# Patient Record
Sex: Female | Born: 1980 | Race: Black or African American | Hispanic: No | Marital: Single | Smoking: Current every day smoker
Health system: Southern US, Community
[De-identification: ages and names within clinical notes are randomized; demographics above are authoritative.]

## PROBLEM LIST (undated history)

## (undated) DIAGNOSIS — J45909 Unspecified asthma, uncomplicated: Secondary | ICD-10-CM

## (undated) DIAGNOSIS — I10 Essential (primary) hypertension: Secondary | ICD-10-CM

## (undated) DIAGNOSIS — G43909 Migraine, unspecified, not intractable, without status migrainosus: Secondary | ICD-10-CM

## (undated) HISTORY — PX: NO PAST SURGERIES: SHX2092

---

## 2011-08-09 ENCOUNTER — Encounter (HOSPITAL_COMMUNITY): Payer: Self-pay | Admitting: Emergency Medicine

## 2011-08-09 ENCOUNTER — Emergency Department (INDEPENDENT_AMBULATORY_CARE_PROVIDER_SITE_OTHER)
Admission: EM | Admit: 2011-08-09 | Discharge: 2011-08-09 | Disposition: A | Discharge status: Other Acute Inpatient Hospital | Payer: Self-pay | Source: Home / Self Care | Attending: Emergency Medicine | Admitting: Emergency Medicine

## 2011-08-09 ENCOUNTER — Encounter (HOSPITAL_COMMUNITY): Payer: Self-pay

## 2011-08-09 DIAGNOSIS — R109 Unspecified abdominal pain: Secondary | ICD-10-CM

## 2011-08-09 DIAGNOSIS — F17200 Nicotine dependence, unspecified, uncomplicated: Secondary | ICD-10-CM | POA: Insufficient documentation

## 2011-08-09 DIAGNOSIS — J45909 Unspecified asthma, uncomplicated: Secondary | ICD-10-CM | POA: Insufficient documentation

## 2011-08-09 DIAGNOSIS — I10 Essential (primary) hypertension: Secondary | ICD-10-CM | POA: Insufficient documentation

## 2011-08-09 DIAGNOSIS — E119 Type 2 diabetes mellitus without complications: Secondary | ICD-10-CM | POA: Insufficient documentation

## 2011-08-09 DIAGNOSIS — D259 Leiomyoma of uterus, unspecified: Secondary | ICD-10-CM | POA: Insufficient documentation

## 2011-08-09 DIAGNOSIS — K859 Acute pancreatitis without necrosis or infection, unspecified: Secondary | ICD-10-CM | POA: Insufficient documentation

## 2011-08-09 HISTORY — DX: Essential (primary) hypertension: I10

## 2011-08-09 HISTORY — DX: Unspecified asthma, uncomplicated: J45.909

## 2011-08-09 LAB — CBC WITH DIFFERENTIAL/PLATELET
Basophils Relative: 0 % (ref 0–1)
Eosinophils Absolute: 0.4 10*3/uL (ref 0.0–0.7)
Eosinophils Relative: 3 % (ref 0–5)
HCT: 39 % (ref 36.0–46.0)
Hemoglobin: 13 g/dL (ref 12.0–15.0)
MCH: 27.5 pg (ref 26.0–34.0)
MCHC: 33.3 g/dL (ref 30.0–36.0)
MCV: 82.6 fL (ref 78.0–100.0)
Monocytes Absolute: 0.7 10*3/uL (ref 0.1–1.0)
Monocytes Relative: 5 % (ref 3–12)
Neutrophils Relative %: 60 % (ref 43–77)

## 2011-08-09 LAB — BASIC METABOLIC PANEL
BUN: 12 mg/dL (ref 6–23)
Calcium: 9.4 mg/dL (ref 8.4–10.5)
Creatinine, Ser: 0.64 mg/dL (ref 0.50–1.10)
GFR calc Af Amer: >90 mL/min (ref >90)
GFR calc non Af Amer: >90 mL/min (ref >90)

## 2011-08-09 LAB — POCT URINALYSIS DIP (DEVICE)
Glucose, UA: 500 mg/dL — AB
Hgb urine dipstick: NEGATIVE
Nitrite: NEGATIVE
Urobilinogen, UA: 1 mg/dL (ref 0.0–1.0)

## 2011-08-09 LAB — GLUCOSE, CAPILLARY: Glucose-Capillary: 282 mg/dL — ABNORMAL HIGH (ref 70–99)

## 2011-08-09 NOTE — ED Notes (Signed)
Pt reports lower abd pain yesterday, and today it is now hurting in L upper abd; denies n/v, denies cp; reports pain does radiate up L side of body though

## 2011-08-09 NOTE — ED Provider Notes (Signed)
History     CSN: [id number]  Arrival date & time 08/09/11  1900   None     Chief Complaint  Patient presents with  . Abdominal Pain    (Consider location/radiation/quality/duration/timing/severity/associated sxs/prior treatment) HPI Comments: Yesterday pt had sharp B lower abd pain, today pain has moved over to L lateral lower quadrant and is now a dull pain.  Pain today radiates to L upper chest on occasion.    Patient is a 31 y.o. female presenting with abdominal pain. The history is provided by the patient.  Abdominal Pain The primary symptoms of the illness include abdominal pain. The primary symptoms of the illness do not include fever, nausea, vomiting, diarrhea, dysuria, vaginal discharge or vaginal bleeding. The current episode started yesterday. The onset of the illness was gradual. The problem has been gradually worsening.  The abdominal pain is located in the LLQ. The abdominal pain radiates to the chest. The severity of the abdominal pain is 8/10. The abdominal pain is relieved by nothing.  The patient states that she believes she is currently not pregnant. The patient has not had a change in bowel habit. Symptoms associated with the illness do not include chills, anorexia, constipation or back pain.    Past Medical History  Diagnosis Date  . Diabetes mellitus   . Asthma   . Hypertension     History reviewed. No pertinent past surgical history.  History reviewed. No pertinent family history.  History  Substance Use Topics  . Smoking status: Not on file  . Smokeless tobacco: Not on file  . Alcohol Use:     OB History    Grav Para Term Preterm Abortions TAB SAB Ect Mult Living                  Review of Systems  Constitutional: Negative for fever and chills.  Gastrointestinal: Positive for abdominal pain. Negative for nausea, vomiting, diarrhea, constipation and anorexia.  Genitourinary: Negative for dysuria, flank pain, vaginal bleeding and vaginal  discharge.  Musculoskeletal: Negative for back pain.  Skin: Negative for rash.    Allergies  Review of patient's allergies indicates no known allergies.  Home Medications   Current Outpatient Rx  Name Route Sig Dispense Refill  . GLIMEPIRIDE 1 MG PO TABS Oral Take 1 mg by mouth daily before breakfast.    . ALBUTEROL SULFATE HFA 108 (90 BASE) MCG/ACT IN AERS Inhalation Inhale 2 puffs into the lungs every 6 (six) hours as needed.    Marland Kitchen METFORMIN HCL 1000 MG PO TABS Oral Take 1,000 mg by mouth 2 (two) times daily with a meal.      BP 129/87  Pulse 88  Temp 98.3 F (36.8 C) (Oral)  Resp 18  SpO2 100%  Physical Exam  Constitutional: She appears well-developed and well-nourished. No distress.       obese  Cardiovascular: Normal rate and regular rhythm.   Pulmonary/Chest: Effort normal and breath sounds normal.  Abdominal: Soft. She exhibits no distension. Bowel sounds are decreased. There is tenderness in the left lower quadrant. There is no rigidity, no rebound, no guarding and no CVA tenderness.       Pt's pain in lateral, Left, upper part of LLQ/lower LUQ area.   Skin: Skin is warm, dry and intact. No rash noted.    ED Course  Procedures (including critical care time)  Labs Reviewed  POCT URINALYSIS DIP (DEVICE) - Abnormal; Notable for the following:    Glucose, UA 500 (*)  Ketones, ur TRACE (*)     All other components within normal limits  GLUCOSE, CAPILLARY - Abnormal; Notable for the following:    Glucose-Capillary 282 (*)     All other components within normal limits  POCT PREGNANCY, URINE   No results found.   1. Abdominal pain   2. Diabetes mellitus       MDM  Given pt's sx, needs dx testing. Transfer to ER for further eval.         Cathlyn Parsons, NP 08/09/11 2140

## 2011-08-09 NOTE — ED Notes (Signed)
C/o pain generalized abdominal area since yesterday , this AM has had sharp pain left flank and developed a tender knot on left flank area and pain in to her left shoulder and side of neck; deceased bowel sounds

## 2011-08-10 ENCOUNTER — Emergency Department (HOSPITAL_COMMUNITY)
Admission: EM | Admit: 2011-08-10 | Discharge: 2011-08-10 | Disposition: A | Discharge status: Home / Self Care | Payer: Self-pay | Attending: Emergency Medicine | Admitting: Emergency Medicine

## 2011-08-10 ENCOUNTER — Emergency Department (HOSPITAL_COMMUNITY): Payer: Self-pay

## 2011-08-10 DIAGNOSIS — D259 Leiomyoma of uterus, unspecified: Secondary | ICD-10-CM

## 2011-08-10 DIAGNOSIS — K859 Acute pancreatitis without necrosis or infection, unspecified: Secondary | ICD-10-CM

## 2011-08-10 DIAGNOSIS — R109 Unspecified abdominal pain: Secondary | ICD-10-CM

## 2011-08-10 HISTORY — DX: Migraine, unspecified, not intractable, without status migrainosus: G43.909

## 2011-08-10 LAB — URINALYSIS, ROUTINE W REFLEX MICROSCOPIC
Bilirubin Urine: NEGATIVE
Ketones, ur: NEGATIVE mg/dL
Nitrite: NEGATIVE
pH: 6 (ref 5.0–8.0)

## 2011-08-10 LAB — HEPATIC FUNCTION PANEL
ALT: 10 U/L (ref 0–35)
AST: 19 U/L (ref 0–37)
Albumin: 3.6 g/dL (ref 3.5–5.2)
Bilirubin, Direct: <0.1 mg/dL (ref 0.0–0.3)
Total Bilirubin: 0.1 mg/dL — ABNORMAL LOW (ref 0.3–1.2)

## 2011-08-10 LAB — GLUCOSE, CAPILLARY

## 2011-08-10 MED ORDER — MORPHINE SULFATE 4 MG/ML IJ SOLN
4.0000 mg | Freq: Once | INTRAMUSCULAR | Status: AC
  Administered 2011-08-10: 4 mg via INTRAVENOUS
  Filled 2011-08-10: qty 1

## 2011-08-10 MED ORDER — SODIUM CHLORIDE 0.9 % IV BOLUS (SEPSIS)
1000.0000 mL | Freq: Once | INTRAVENOUS | Status: AC
  Administered 2011-08-10: 1000 mL via INTRAVENOUS

## 2011-08-10 MED ORDER — IOHEXOL 300 MG/ML  SOLN: 100.0000 mL | Freq: Once | INTRAMUSCULAR | Status: DC | PRN

## 2011-08-10 MED ORDER — PROMETHAZINE HCL 25 MG PO TABS: 25.0000 mg | ORAL_TABLET | Freq: Four times a day (QID) | ORAL | Status: DC | PRN

## 2011-08-10 MED ORDER — IOHEXOL 300 MG/ML  SOLN
20.0000 mL | INTRAMUSCULAR | Status: AC
  Administered 2011-08-10: 20 mL via ORAL

## 2011-08-10 MED ORDER — ONDANSETRON HCL 4 MG/2ML IJ SOLN
4.0000 mg | Freq: Once | INTRAMUSCULAR | Status: AC
  Administered 2011-08-10: 4 mg via INTRAVENOUS
  Filled 2011-08-10: qty 2

## 2011-08-10 MED ORDER — HYDROCODONE-ACETAMINOPHEN 5-325 MG PO TABS: 1.0000 | ORAL_TABLET | ORAL | Status: AC | PRN

## 2011-08-10 NOTE — ED Notes (Signed)
The pt was sen here from [facility] with multiple symptoms since yesterday.  She has had rt abd pain  Neck pain and knots in her lt chest abd.  No distress

## 2011-08-10 NOTE — ED Notes (Signed)
1000 cc nss added to iv 

## 2011-08-10 NOTE — ED Notes (Signed)
Back to u/s

## 2011-08-10 NOTE — ED Notes (Signed)
To ct

## 2011-08-10 NOTE — ED Provider Notes (Signed)
History     CSN: [id number]  Arrival date & time 08/09/11  2144   First MD Initiated Contact with Patient 08/10/11 0110      Chief Complaint  Patient presents with  . Abdominal Pain   HPI  History provided by the patient. Patient is a 31 year old female with history of diabetes, hypertension, asthma migraines who presents with complaints of left-sided abdominal pain that began yesterday. Pain is described as a constant sharp pain does worsen. Pain is primary located in the left lower abdomen. Patient also reports feeling a bulge or mass to the area. Pain does radiate some to the left upper quadrant and side of abdomen. Patient denies any appetite change. She denies any fever, chills, nausea, vomiting. She denies any dysuria, hematuria, urinary frequency, vaginal bleeding or vaginal discharge. Patient does report currently being on menstrual cycle. She denies any abnormalities.    Past Medical History  Diagnosis Date  . Diabetes mellitus   . Asthma   . Hypertension   . Migraine     No past surgical history on file.  No family history on file.  History  Substance Use Topics  . Smoking status: Current Everyday Smoker -- 0.5 packs/day  . Smokeless tobacco: Not on file  . Alcohol Use: Yes     occasion    OB History    Grav Para Term Preterm Abortions TAB SAB Ect Mult Living                  Review of Systems  Constitutional: Negative for fever, chills and appetite change.  Respiratory: Negative for cough and shortness of breath.   Cardiovascular: Negative for chest pain.  Gastrointestinal: Positive for abdominal pain. Negative for nausea, vomiting, diarrhea, constipation and blood in stool.  Genitourinary: Negative for dysuria, frequency, hematuria, flank pain, vaginal discharge and menstrual problem.    Allergies  Review of patient's allergies indicates no known allergies.  Home Medications   Current Outpatient Rx  Name Route Sig Dispense Refill  . ALBUTEROL  SULFATE HFA 108 (90 BASE) MCG/ACT IN AERS Inhalation Inhale 2 puffs into the lungs every 6 (six) hours as needed. For shortness of breath    . GLIMEPIRIDE 1 MG PO TABS Oral Take 1 mg by mouth daily before breakfast.    . METFORMIN HCL 1000 MG PO TABS Oral Take 1,000 mg by mouth 2 (two) times daily with a meal.      BP 121/87  Pulse 87  Temp 96.9 F (36.1 C) (Oral)  Resp 16  SpO2 99%  LMP 07/31/2011  Physical Exam  Nursing note and vitals reviewed. Constitutional: She is oriented to person, place, and time. She appears well-developed and well-nourished. No distress.  HENT:  Head: Normocephalic.  Cardiovascular: Normal rate and regular rhythm.   Pulmonary/Chest: Effort normal and breath sounds normal. No respiratory distress. She has no wheezes. She has no rales.  Abdominal: Soft. There is tenderness in the left upper quadrant and left lower quadrant. There is no rebound, no guarding, no CVA tenderness, no tenderness at McBurney's point and negative Murphy's sign.       Obese.  Genitourinary:       Chaperone was present. Patient was unable to tolerate speculum exam secondary to discomfort. Patient also had significant discomfort on bimanual exam. She does not seem to have any significant adnexal tenderness  Neurological: She is alert and oriented to person, place, and time.  Skin: Skin is warm and dry. No rash noted.  Psychiatric: She has a normal mood and affect. Her behavior is normal.    ED Course  Procedures   Results for orders placed during the hospital encounter of 08/10/11  URINALYSIS, ROUTINE W REFLEX MICROSCOPIC      Component Value Range   Color, Urine YELLOW  YELLOW   APPearance CLOUDY (*) CLEAR   Specific Gravity, Urine 1.015  1.005 - 1.030   pH 6.0  5.0 - 8.0   Glucose, UA >1000 (*) NEGATIVE mg/dL   Hgb urine dipstick TRACE (*) NEGATIVE   Bilirubin Urine NEGATIVE  NEGATIVE   Ketones, ur NEGATIVE  NEGATIVE mg/dL   Protein, ur NEGATIVE  NEGATIVE mg/dL    Urobilinogen, UA 0.2  0.0 - 1.0 mg/dL   Nitrite NEGATIVE  NEGATIVE   Leukocytes, UA NEGATIVE  NEGATIVE  CBC WITH DIFFERENTIAL      Component Value Range   WBC 14.4 (*) 4.0 - 10.5 K/uL   RBC 4.72  3.87 - 5.11 MIL/uL   Hemoglobin 13.0  12.0 - 15.0 g/dL   HCT 36.6  44.0 - 34.7 %   MCV 82.6  78.0 - 100.0 fL   MCH 27.5  26.0 - 34.0 pg   MCHC 33.3  30.0 - 36.0 g/dL   RDW 42.5  95.6 - 38.7 %   Platelets 346  150 - 400 K/uL   Neutrophils Relative 60  43 - 77 %   Neutro Abs 8.6 (*) 1.7 - 7.7 K/uL   Lymphocytes Relative 33  12 - 46 %   Lymphs Abs 4.7 (*) 0.7 - 4.0 K/uL   Monocytes Relative 5  3 - 12 %   Monocytes Absolute 0.7  0.1 - 1.0 K/uL   Eosinophils Relative 3  0 - 5 %   Eosinophils Absolute 0.4  0.0 - 0.7 K/uL   Basophils Relative 0  0 - 1 %   Basophils Absolute 0.0  0.0 - 0.1 K/uL  BASIC METABOLIC PANEL      Component Value Range   Sodium 138  135 - 145 mEq/L   Potassium 3.6  3.5 - 5.1 mEq/L   Chloride 101  96 - 112 mEq/L   CO2 26  19 - 32 mEq/L   Glucose, Bld 325 (*) 70 - 99 mg/dL   BUN 12  6 - 23 mg/dL   Creatinine, Ser 5.64  0.50 - 1.10 mg/dL   Calcium 9.4  8.4 - 33.2 mg/dL   GFR calc non Af Amer >90  >90 mL/min   GFR calc Af Amer >90  >90 mL/min  URINE MICROSCOPIC-ADD ON      Component Value Range   Squamous Epithelial / LPF RARE  RARE   WBC, UA 3-6  <3 WBC/hpf   RBC / HPF 3-6  <3 RBC/hpf   Bacteria, UA RARE  RARE  HEPATIC FUNCTION PANEL      Component Value Range   Total Protein 7.6  6.0 - 8.3 g/dL   Albumin 3.6  3.5 - 5.2 g/dL   AST 19  0 - 37 U/L   ALT 10  0 - 35 U/L   Alkaline Phosphatase 83  39 - 117 U/L   Total Bilirubin 0.1 (*) 0.3 - 1.2 mg/dL   Bilirubin, Direct <9.5  0.0 - 0.3 mg/dL   Indirect Bilirubin NOT CALCULATED  0.3 - 0.9 mg/dL  LIPASE, BLOOD      Component Value Range   Lipase 122 (*) 11 - 59 U/L  POCT PREGNANCY, URINE  Component Value Range   Preg Test, Ur NEGATIVE  NEGATIVE       US Pelvis Complete  08/10/2011  *RADIOLOGY REPORT*   Clinical Data: Evaluate fibroid from CT scan.  TRANSABDOMINAL ULTRASOUND OF PELVIS  Technique:  Transabdominal ultrasound examination of the pelvis was performed including evaluation of the uterus, ovaries, adnexal regions, and pelvic cul-de-sac.  Comparison:  CT 08/10/2011  Findings:  Uterus:  The uterus is anteverted and measures 9.6 x 5.6 x 8.5 cm. There is a heterogeneous mass arising from the uterine fundus measuring about 4.8 x 4.5 x 5 cm consistent with a fibroid.  Endometrium: The endometrium is obscured by the large fibroid is not visualized.  Right ovary: Right ovary is not visualized.  Left ovary: Left ovary is not visualized.  Other Findings:  No free fluid  IMPRESSION: Heterogeneous mass on the uterus most consistent with fibroid. Ovaries not visualized.  No free fluid.  Original Report Authenticated By: Marlon Pel, M.D.   Ct Abdomen Pelvis W Contrast  08/10/2011  *RADIOLOGY REPORT*  Clinical Data: Left upper quadrant abdominal pain.  Left flank pain.  Decreased bowel sounds.  CT ABDOMEN AND PELVIS WITH CONTRAST  Technique:  Multidetector CT imaging of the abdomen and pelvis was performed following the standard protocol during bolus administration of intravenous contrast.  Contrast:  100 ml Omnipaque 300.  Hand injection due to a 22 gauge IV catheter.  Comparison: None.  Findings: Dependent atelectasis in the lung bases.  Limited contrast bolus results in a imaging during the equilibrium phase of the liver and the dilated phase of the kidneys. The  The liver, spleen, gallbladder, pancreas, adrenal glands, kidneys, abdominal aorta, and retroperitoneal lymph nodes are unremarkable. The stomach, small bowel, and colon are not abnormally distended. No free air or free fluid in the abdomen.  Pelvis:  Mass in the uterus with peripheral enhancement measuring 6.9 x 4.8 cm.  This is likely represent uterine fibroid. Infiltrative process is not excluded.  Consider ultrasound for further evaluation and  characterization.  No abnormal adnexal masses.  The bladder wall is not thickened.  No free or loculated pelvic fluid collections.  No significant pelvic lymphadenopathy. The appendix is normal.  No evidence of diverticulitis.  Normal alignment of the lumbar vertebrae.  IMPRESSION: Peripherally enhancing mass in the uterus probably represents a fibroid but ultrasound is suggested to exclude infiltrating lesion. Abdominal and pelvic structures are otherwise unremarkable.  Original Report Authenticated By: Marlon Pel, M.D.     1. Abdominal pain   2. Pancreatitis   3. Uterine fibroid       MDM  Patient seen and evaluated. Patient sent for urgent care for evaluation of abdominal pains.   I discussed with patient recommendations perform pelvic exam. She is not wish to have this performed.  Pt reports being a virgin.  Patient with elevated WBC. Patient is not wish to have pelvic exam performed. Will order CT scan.  CT with no acute or emergent findings. There is a mass of the uterus most likely fibroid. Recommended to have ultrasound followup.  I discussed CT findings with patient. I did recommend patient have pelvic examination. She did agree to this as well a stat ultrasound for further evaluation.     Angus Seller, [place] 08/11/11 [phone]

## 2011-08-10 NOTE — ED Notes (Signed)
The pt is a diabetic poorly controlled.  She takes her oral med sometimes most of the time she does not take it.  Drinking contrast.  Iv placed med given

## 2011-08-10 NOTE — ED Notes (Signed)
The pt just returned from  c-t 

## 2011-08-11 NOTE — ED Provider Notes (Signed)
Medical screening examination/treatment/procedure(s) were performed by non-physician practitioner and as supervising physician I was immediately available for consultation/collaboration.  Cyndra Numbers, MD 08/11/11 1500

## 2011-08-12 NOTE — ED Provider Notes (Signed)
Medical screening examination/treatment/procedure(s) were performed by non-physician practitioner and as supervising physician I was immediately available for consultation/collaboration.  Annett Boxwell M. MD   Whitaker Holderman M Jerrick Farve, MD 08/12/11 1121 

## 2011-12-20 ENCOUNTER — Emergency Department (HOSPITAL_COMMUNITY)
Admission: EM | Admit: 2011-12-20 | Discharge: 2011-12-20 | Disposition: A | Discharge status: Home / Self Care | Payer: Self-pay | Attending: Emergency Medicine | Admitting: Emergency Medicine

## 2011-12-20 ENCOUNTER — Encounter (HOSPITAL_COMMUNITY): Payer: Self-pay | Admitting: Emergency Medicine

## 2011-12-20 DIAGNOSIS — L02229 Furuncle of trunk, unspecified: Secondary | ICD-10-CM | POA: Diagnosis present

## 2011-12-20 DIAGNOSIS — Z3202 Encounter for pregnancy test, result negative: Secondary | ICD-10-CM | POA: Insufficient documentation

## 2011-12-20 DIAGNOSIS — Z9119 Patient's noncompliance with other medical treatment and regimen: Secondary | ICD-10-CM | POA: Insufficient documentation

## 2011-12-20 DIAGNOSIS — L02838 Carbuncle of other sites: Secondary | ICD-10-CM | POA: Insufficient documentation

## 2011-12-20 DIAGNOSIS — Z91199 Patient's noncompliance with other medical treatment and regimen due to unspecified reason: Secondary | ICD-10-CM | POA: Insufficient documentation

## 2011-12-20 DIAGNOSIS — E1165 Type 2 diabetes mellitus with hyperglycemia: Secondary | ICD-10-CM | POA: Diagnosis present

## 2011-12-20 DIAGNOSIS — Z79899 Other long term (current) drug therapy: Secondary | ICD-10-CM | POA: Insufficient documentation

## 2011-12-20 DIAGNOSIS — F17200 Nicotine dependence, unspecified, uncomplicated: Secondary | ICD-10-CM | POA: Insufficient documentation

## 2011-12-20 DIAGNOSIS — Z8679 Personal history of other diseases of the circulatory system: Secondary | ICD-10-CM | POA: Insufficient documentation

## 2011-12-20 DIAGNOSIS — L02828 Furuncle of other sites: Secondary | ICD-10-CM | POA: Insufficient documentation

## 2011-12-20 DIAGNOSIS — J45909 Unspecified asthma, uncomplicated: Secondary | ICD-10-CM | POA: Insufficient documentation

## 2011-12-20 DIAGNOSIS — E1169 Type 2 diabetes mellitus with other specified complication: Secondary | ICD-10-CM | POA: Insufficient documentation

## 2011-12-20 DIAGNOSIS — L519 Erythema multiforme, unspecified: Secondary | ICD-10-CM | POA: Insufficient documentation

## 2011-12-20 DIAGNOSIS — I10 Essential (primary) hypertension: Secondary | ICD-10-CM | POA: Insufficient documentation

## 2011-12-20 DIAGNOSIS — R599 Enlarged lymph nodes, unspecified: Secondary | ICD-10-CM | POA: Insufficient documentation

## 2011-12-20 LAB — BASIC METABOLIC PANEL
BUN: 9 mg/dL (ref 6–23)
Calcium: 10 mg/dL (ref 8.4–10.5)
GFR calc non Af Amer: >90 mL/min (ref >90)
Glucose, Bld: 360 mg/dL — ABNORMAL HIGH (ref 70–99)

## 2011-12-20 LAB — URINALYSIS, ROUTINE W REFLEX MICROSCOPIC
Leukocytes, UA: NEGATIVE
Nitrite: NEGATIVE
Specific Gravity, Urine: 1.042 — ABNORMAL HIGH (ref 1.005–1.030)
Urobilinogen, UA: 0.2 mg/dL (ref 0.0–1.0)

## 2011-12-20 LAB — URINE MICROSCOPIC-ADD ON

## 2011-12-20 LAB — POCT PREGNANCY, URINE: Preg Test, Ur: NEGATIVE

## 2011-12-20 MED ORDER — GLIMEPIRIDE 1 MG PO TABS: 1.0000 mg | ORAL_TABLET | Freq: Every day | ORAL | Status: DC

## 2011-12-20 MED ORDER — METFORMIN HCL 1000 MG PO TABS: 1000.0000 mg | ORAL_TABLET | Freq: Two times a day (BID) | ORAL | Status: DC

## 2011-12-20 MED ORDER — CLINDAMYCIN HCL 300 MG PO CAPS: 300.0000 mg | ORAL_CAPSULE | Freq: Three times a day (TID) | ORAL | Status: DC

## 2011-12-20 MED ORDER — SODIUM CHLORIDE 0.9 % IV BOLUS (SEPSIS)
1000.0000 mL | Freq: Once | INTRAVENOUS | Status: AC
  Administered 2011-12-20: 1000 mL via INTRAVENOUS

## 2011-12-20 NOTE — ED Notes (Signed)
Pt stated she did not want the pelvic exam done. Provider informed.

## 2011-12-20 NOTE — ED Notes (Signed)
Pt c/o left sided groin pain with vaginal discharge and irritation; pt sts out of DM meds x 1 month; pt sts some intermittent nose bleeds as well

## 2011-12-20 NOTE — ED Provider Notes (Signed)
History     CSN: [id number]  Arrival date & time 12/20/11  1058   First MD Initiated Contact with Patient 12/20/11 1424      Chief Complaint  Patient presents with  . Groin Pain  . Vaginal Discharge  . Epistaxis    (Consider location/radiation/quality/duration/timing/severity/associated sxs/prior treatment) The history is provided by the patient and a significant other.   31yo F with PMH DM II presents with left sided groin pain with new onset pain and erythema of her mons pubis. The groin pain has been occurring for 1 week with onset of erythema and mons pubis pain x2 days. She states that she has frequent episodes similar to these. Of note, she states that she has been out of her DM meds x 1 month, and usually takes Metformin and glimeprirde. She does seem to have some noncompliance with her medications and states that she usually does not take the Metformin due to associated diarrhea.   Past Medical History  Diagnosis Date  . Diabetes mellitus   . Asthma   . Hypertension   . Migraine     History reviewed. No pertinent past surgical history.  History reviewed. No pertinent family history.  History  Substance Use Topics  . Smoking status: Current Every Day Smoker -- 0.5 packs/day  . Smokeless tobacco: Not on file  . Alcohol Use: Yes     Comment: occasion    OB History    Grav Para Term Preterm Abortions TAB SAB Ect Mult Living                  Review of Systems  All other systems reviewed and are negative.    Allergies  Review of patient's allergies indicates no known allergies.  Home Medications   Current Outpatient Rx  Name  Route  Sig  Dispense  Refill  . ALBUTEROL SULFATE HFA 108 (90 BASE) MCG/ACT IN AERS   Inhalation   Inhale 2 puffs into the lungs every 6 (six) hours as needed. For shortness of breath         . CLINDAMYCIN HCL 300 MG PO CAPS   Oral   Take 1 capsule (300 mg total) by mouth 3 (three) times daily.   21 capsule   0   .  GLIMEPIRIDE 1 MG PO TABS   Oral   Take 1 tablet (1 mg total) by mouth daily before breakfast.   30 tablet   11   . METFORMIN HCL 1000 MG PO TABS   Oral   Take 1 tablet (1,000 mg total) by mouth 2 (two) times daily with a meal.   60 tablet   11   . PROMETHAZINE HCL 25 MG PO TABS   Oral   Take 1 tablet (25 mg total) by mouth every 6 (six) hours as needed for nausea.   20 tablet   0     BP 122/89  Pulse 95  Temp 98.4 F (36.9 C) (Oral)  Resp 18  SpO2 100%  Physical Exam  Constitutional: She is oriented to person, place, and time. She appears well-developed and well-nourished.  HENT:  Head: Normocephalic and atraumatic.  Eyes: EOM are normal. Pupils are equal, round, and reactive to light.  Neck: Normal range of motion.  Cardiovascular: Normal rate and regular rhythm.   Pulmonary/Chest: Effort normal and breath sounds normal.  Abdominal: Soft. She exhibits no distension and no mass. There is no tenderness. There is no guarding. Hernia confirmed negative in the left  inguinal area.  Genitourinary:    No signs of injury around the vagina. No vaginal discharge found.       Pt refused pelvic exam; she states that she has never been sexually active.  Musculoskeletal: Normal range of motion.  Lymphadenopathy:       Left: Inguinal adenopathy present.       Singular left inguinal adenopathy   Neurological: She is alert and oriented to person, place, and time. No cranial nerve deficit.  Psychiatric: She has a normal mood and affect. Her behavior is normal.    ED Course  Procedures (including critical care time)  Labs Reviewed  URINALYSIS, ROUTINE W REFLEX MICROSCOPIC - Abnormal; Notable for the following:    Specific Gravity, Urine 1.042 (*)     Glucose, UA >1000 (*)     Hgb urine dipstick TRACE (*)     Ketones, ur 15 (*)     All other components within normal limits  BASIC METABOLIC PANEL - Abnormal; Notable for the following:    Glucose, Bld 360 (*)     All other  components within normal limits  POCT PREGNANCY, URINE  URINE MICROSCOPIC-ADD ON   No results found.   1. Type 2 diabetes mellitus with hyperglycemia   2. Furuncle of pubic region       MDM   31yo F with PMH uncontrolled diabetes who has been off of her medications for 1 month presents with 1 week of left sided groin pain, and 2 days of tenderness, erythema, and induration of 1cm diameter of the mons pubis. Groin pain likely reactive lymphadenopathy. Furuncle likely contributed by uncontrolled hyperglycemia. Blood glucose 360, with normal anion gap.  Pt to perform warm compresses and Sitz baths BID-TID after discharge. Prescribing Clindamycin 300mg  TID x7 days. She is to f/u with her PCP or the ED as needed and if her symptoms worsen        Genelle Gather, MD 12/20/11 1616  Genelle Gather, MD 12/20/11 [phone]

## 2011-12-20 NOTE — ED Provider Notes (Signed)
  I performed a history and physical examination of Catherine Cooley and discussed her management with Dr. Sherrine Maples.  I agree with the history, physical, assessment, and plan of care, with the following exceptions: None   Brentney Goldbach, Elvis Coil, MD 12/20/11 1651

## 2012-06-04 ENCOUNTER — Emergency Department (HOSPITAL_COMMUNITY)
Admission: EM | Admit: 2012-06-04 | Discharge: 2012-06-04 | Disposition: A | Discharge status: Home / Self Care | Payer: Self-pay

## 2012-06-04 NOTE — ED Notes (Signed)
Unable to locate pt x2

## 2012-06-04 NOTE — ED Notes (Signed)
Pt called in main ED waiting area with no response 

## 2012-06-04 NOTE — ED Notes (Signed)
Called patient x 1 w/o answer.

## 2012-09-12 ENCOUNTER — Encounter (HOSPITAL_COMMUNITY): Payer: Self-pay | Admitting: Emergency Medicine

## 2012-09-12 ENCOUNTER — Emergency Department (HOSPITAL_COMMUNITY)
Admission: EM | Admit: 2012-09-12 | Discharge: 2012-09-12 | Disposition: A | Discharge status: Home / Self Care | Payer: Self-pay | Attending: Emergency Medicine | Admitting: Emergency Medicine

## 2012-09-12 ENCOUNTER — Emergency Department (HOSPITAL_COMMUNITY): Payer: Self-pay

## 2012-09-12 DIAGNOSIS — F17200 Nicotine dependence, unspecified, uncomplicated: Secondary | ICD-10-CM | POA: Insufficient documentation

## 2012-09-12 DIAGNOSIS — S4991XA Unspecified injury of right shoulder and upper arm, initial encounter: Secondary | ICD-10-CM

## 2012-09-12 DIAGNOSIS — J45909 Unspecified asthma, uncomplicated: Secondary | ICD-10-CM | POA: Insufficient documentation

## 2012-09-12 DIAGNOSIS — Z79899 Other long term (current) drug therapy: Secondary | ICD-10-CM | POA: Insufficient documentation

## 2012-09-12 DIAGNOSIS — W503XXA Accidental bite by another person, initial encounter: Secondary | ICD-10-CM

## 2012-09-12 DIAGNOSIS — S41109A Unspecified open wound of unspecified upper arm, initial encounter: Secondary | ICD-10-CM | POA: Insufficient documentation

## 2012-09-12 DIAGNOSIS — Z23 Encounter for immunization: Secondary | ICD-10-CM | POA: Insufficient documentation

## 2012-09-12 DIAGNOSIS — S4980XA Other specified injuries of shoulder and upper arm, unspecified arm, initial encounter: Secondary | ICD-10-CM | POA: Insufficient documentation

## 2012-09-12 DIAGNOSIS — E119 Type 2 diabetes mellitus without complications: Secondary | ICD-10-CM | POA: Insufficient documentation

## 2012-09-12 DIAGNOSIS — I10 Essential (primary) hypertension: Secondary | ICD-10-CM | POA: Insufficient documentation

## 2012-09-12 DIAGNOSIS — S46909A Unspecified injury of unspecified muscle, fascia and tendon at shoulder and upper arm level, unspecified arm, initial encounter: Secondary | ICD-10-CM | POA: Insufficient documentation

## 2012-09-12 DIAGNOSIS — Z792 Long term (current) use of antibiotics: Secondary | ICD-10-CM | POA: Insufficient documentation

## 2012-09-12 LAB — GLUCOSE, CAPILLARY

## 2012-09-12 MED ORDER — AMOXICILLIN-POT CLAVULANATE 875-125 MG PO TABS: 1.0000 | ORAL_TABLET | Freq: Two times a day (BID) | ORAL | Status: DC

## 2012-09-12 MED ORDER — FLUCONAZOLE 100 MG PO TABS
150.0000 mg | ORAL_TABLET | Freq: Once | ORAL | Status: AC
  Administered 2012-09-12: 150 mg via ORAL
  Filled 2012-09-12: qty 2

## 2012-09-12 MED ORDER — TETANUS-DIPHTH-ACELL PERTUSSIS 5-2.5-18.5 LF-MCG/0.5 IM SUSP
0.5000 mL | Freq: Once | INTRAMUSCULAR | Status: AC
  Administered 2012-09-12: 0.5 mL via INTRAMUSCULAR
  Filled 2012-09-12: qty 0.5

## 2012-09-12 MED ORDER — HYDROCODONE-ACETAMINOPHEN 5-325 MG PO TABS: 1.0000 | ORAL_TABLET | Freq: Four times a day (QID) | ORAL | Status: DC | PRN
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
## 2012-09-12 NOTE — ED Notes (Signed)
CBG is 327. Notified Nurse Scarlett.
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
## 2012-09-13 NOTE — ED Provider Notes (Signed)
Medical screening examination/treatment/procedure(s) were performed by non-physician practitioner and as supervising physician I was immediately available for consultation/collaboration.  Dione Booze, MD 09/13/12 [phone]

## 2012-09-14 NOTE — ED Provider Notes (Signed)
History/physical exam/procedure(s) were performed by non-physician practitioner and as supervising physician I was immediately available for consultation/collaboration. I have reviewed all notes and am in agreement with care and plan.   Hilario Quarry, MD 09/14/12 [phone]

## 2012-11-12 ENCOUNTER — Inpatient Hospital Stay (HOSPITAL_COMMUNITY)
Admission: AD | Admit: 2012-11-12 | Discharge: 2012-11-12 | Disposition: A | Discharge status: Home / Self Care | Payer: Self-pay | Source: Ambulatory Visit | Attending: Obstetrics and Gynecology | Admitting: Obstetrics and Gynecology

## 2012-11-12 ENCOUNTER — Encounter (HOSPITAL_COMMUNITY): Payer: Self-pay | Admitting: *Deleted

## 2012-11-12 DIAGNOSIS — E1165 Type 2 diabetes mellitus with hyperglycemia: Secondary | ICD-10-CM | POA: Insufficient documentation

## 2012-11-12 DIAGNOSIS — L293 Anogenital pruritus, unspecified: Secondary | ICD-10-CM | POA: Insufficient documentation

## 2012-11-12 DIAGNOSIS — IMO0002 Reserved for concepts with insufficient information to code with codable children: Secondary | ICD-10-CM | POA: Insufficient documentation

## 2012-11-12 DIAGNOSIS — B3731 Acute candidiasis of vulva and vagina: Secondary | ICD-10-CM | POA: Insufficient documentation

## 2012-11-12 DIAGNOSIS — B379 Candidiasis, unspecified: Secondary | ICD-10-CM

## 2012-11-12 DIAGNOSIS — B373 Candidiasis of vulva and vagina: Secondary | ICD-10-CM | POA: Insufficient documentation

## 2012-11-12 DIAGNOSIS — R252 Cramp and spasm: Secondary | ICD-10-CM | POA: Insufficient documentation

## 2012-11-12 LAB — URINE MICROSCOPIC-ADD ON

## 2012-11-12 LAB — URINALYSIS, ROUTINE W REFLEX MICROSCOPIC
Glucose, UA: >1000 mg/dL — AB
Specific Gravity, Urine: 1.015 (ref 1.005–1.030)
Urobilinogen, UA: 0.2 mg/dL (ref 0.0–1.0)

## 2012-11-12 LAB — POCT PREGNANCY, URINE: Preg Test, Ur: NEGATIVE

## 2012-11-12 LAB — COMPREHENSIVE METABOLIC PANEL
Albumin: 3.8 g/dL (ref 3.5–5.2)
BUN: 11 mg/dL (ref 6–23)
Chloride: 100 mEq/L (ref 96–112)
Creatinine, Ser: 0.61 mg/dL (ref 0.50–1.10)
Total Bilirubin: 0.2 mg/dL — ABNORMAL LOW (ref 0.3–1.2)

## 2012-11-12 LAB — HEMOGLOBIN A1C: Hgb A1c MFr Bld: 13.1 % — ABNORMAL HIGH (ref <5.7)

## 2012-11-12 MED ORDER — FLUCONAZOLE 150 MG PO TABS: 150.0000 mg | ORAL_TABLET | Freq: Once | ORAL | Status: DC

## 2012-11-12 MED ORDER — GLYBURIDE 2.5 MG PO TABS: 5.0000 mg | ORAL_TABLET | Freq: Every day | ORAL | Status: DC

## 2012-11-12 MED ORDER — METFORMIN HCL 1000 MG PO TABS: 500.0000 mg | ORAL_TABLET | Freq: Two times a day (BID) | ORAL | Status: DC

## 2012-11-12 NOTE — MAU Note (Signed)
Pt reports she is having vaginal irritation. Reports B.S is out of control on oral medications. (does not have glucometer does not check b.s.) Jus t feels her b.s is Out of control. C/o pain in her legs when she sleeps and weigh loss. Has not seen a doctor in over a year. Gets her refills for her metformin and glyburide  From the ED.

## 2012-11-12 NOTE — MAU Provider Note (Signed)
None     Chief Complaint:  Vaginitis   Catherine Cooley is  32 y.o. G0P0.  Patient's last menstrual period was 10/17/2012.Marland Kitchen  Her pregnancy status is negative.  She presents complaining of vaginal itch c/w yeast infection for several days.  She has taken one diflucan and noticed some improvement.  She is in a same sex relationship and has never had intercourse  Pt also has diabetes for which she does not monitor.  She gets her rx from ED because she does not have insurance.  Also c/o leg cramps at night and weight loss.   Past Medical History  Diagnosis Date  . Diabetes mellitus   . Asthma   . Hypertension   . Migraine     Past Surgical History  Procedure Laterality Date  . No past surgeries      Family History  Problem Relation Age of Onset  . Hypertension Mother   . Diabetes Father     History  Substance Use Topics  . Smoking status: Current Every Day Smoker -- 0.50 packs/day  . Smokeless tobacco: Not on file  . Alcohol Use: Yes     Comment: occasion    Allergies: No Known Allergies  Prescriptions prior to admission  Medication Sig Dispense Refill  . OVER THE COUNTER MEDICATION Place 1 application vaginally 2 (two) times daily as needed (for yeast infection). Over the counter anti-fungal cream      . [DISCONTINUED] glyBURIDE (DIABETA) 2.5 MG tablet Take 2.5 mg by mouth daily with breakfast.      . [DISCONTINUED] metFORMIN (GLUCOPHAGE) 1000 MG tablet Take 1,000 mg by mouth 2 (two) times daily with a meal.         Review of Systems   Constitutional: Negative for fever and chills Eyes: Negative for visual disturbances Respiratory: Negative for shortness of breath, dyspnea Cardiovascular: Negative for chest pain or palpitations  Gastrointestinal: Negative for vomiting, diarrhea and constipation Genitourinary: Negative for dysuria and urgency Musculoskeletal: Negative for back pain, joint pain  Neurological: Negative for dizziness and  headaches     Physical Exam   Blood pressure 126/77, pulse 93, temperature 98.9 F (37.2 C), temperature source Oral, resp. rate 18, height 5\' 10"  (1.778 m), weight 86.637 kg (191 lb), last menstrual period 10/17/2012.  General: General appearance - alert, well appearing, and in no distress Chest - clear to auscultation, no wheezes, rales or rhonchi, symmetric air entry Heart - normal rate and regular rhythm Abdomen - soft, nontender, nondistended, no masses or organomegaly Pelvic - exam declined by the patient, external inspection reveals red labia majora.  No discharge obtained from blilnd q tip swab Extremities - no pedal edema noted   Labs: Results for orders placed during the hospital encounter of 11/12/12 (from the past 24 hour(s))  URINALYSIS, ROUTINE W REFLEX MICROSCOPIC   Collection Time    11/12/12  5:25 PM      Result Value Range   Color, Urine YELLOW  YELLOW   APPearance CLEAR  CLEAR   Specific Gravity, Urine 1.015  1.005 - 1.030   pH 6.0  5.0 - 8.0   Glucose, UA >1000 (*) NEGATIVE mg/dL   Hgb urine dipstick TRACE (*) NEGATIVE   Bilirubin Urine NEGATIVE  NEGATIVE   Ketones, ur 15 (*) NEGATIVE mg/dL   Protein, ur NEGATIVE  NEGATIVE mg/dL   Urobilinogen, UA 0.2  0.0 - 1.0 mg/dL   Nitrite NEGATIVE  NEGATIVE   Leukocytes, UA NEGATIVE  NEGATIVE  URINE MICROSCOPIC-ADD ON  Collection Time    11/12/12  5:25 PM      Result Value Range   Squamous Epithelial / LPF RARE  RARE   RBC / HPF 0-2  <3 RBC/hpf  POCT PREGNANCY, URINE   Collection Time    11/12/12  6:04 PM      Result Value Range   Preg Test, Ur NEGATIVE  NEGATIVE   POCT CBG:  286 Imaging Studies:  No results found.   Assessment: Probable yeast infection, will treat based on sx Diabetes, not controlled Nocturnal leg cramps  Plan:   Diflucan 150mg  qd X 5 days  Discussed with Dr. Ike Bene.  He agrees to see pt in [facility] to manage her diabetes and investigate leg cramps and weight loss.  Labs ordered in  anticipation of that visit.  Pt states she cannot take Metformin more than 500mg  BID because of GI side effects.    CRESENZO-DISHMAN,Mujtaba Bollig

## 2012-11-12 NOTE — MAU Note (Signed)
Pt states she has a vaginal infection "yeast" that comes from her sugar, which is uncontrolled

## 2012-11-12 NOTE — Progress Notes (Signed)
Pt states she has been having pain in her groin area that is on going

## 2012-11-13 LAB — MICROALBUMIN / CREATININE URINE RATIO
Creatinine, Urine: 45.4 mg/dL
Microalb Creat Ratio: 11 mg/g (ref 0.0–30.0)
Microalb, Ur: 0.5 mg/dL (ref 0.00–1.89)

## 2012-11-13 NOTE — MAU Provider Note (Signed)
Attestation of Attending Supervision of Advanced Practitioner (CNM/NP): Evaluation and management procedures were performed by the Advanced Practitioner under my supervision and collaboration.  I have reviewed the Advanced Practitioner's note and chart, and I agree with the management and plan.  Catherine Cooley 11/13/2012 6:15 AM

## 2012-11-18 ENCOUNTER — Telehealth: Payer: Self-pay | Admitting: *Deleted

## 2012-11-18 NOTE — Telephone Encounter (Signed)
Message copied by Radene Ou on Wed Nov 18, 2012 11:25 AM ------      Message from: Jolyn Lent R      Created: Thu Nov 12, 2012  6:22 PM       Howdy,      Sorry I know you aren't the first person to send to but can you please help this person establish care or forward to the right person? She can come see me or a resident in clinic, but does not have insurance.             Pt has uncontrolled diabetes and has no PCM to help manage her care.            Thanks      Hinda Lenis ------

## 2012-11-18 NOTE — Telephone Encounter (Signed)
Will send to Medical Records in our office and start there,  Radene Ou, [place]'

## 2012-11-18 NOTE — Telephone Encounter (Signed)
I will forward to front office who takes care that, Radene Ou, CMA

## 2012-11-18 NOTE — Telephone Encounter (Signed)
Message copied by Radene Ou on Wed Nov 18, 2012 11:24 AM ------      Message from: Jolyn Lent R      Created: Thu Nov 12, 2012  6:22 PM       Howdy,      Sorry I know you aren't the first person to send to but can you please help this person establish care or forward to the right person? She can come see me or a resident in clinic, but does not have insurance.             Pt has uncontrolled diabetes and has no PCM to help manage her care.            Thanks      Hinda Lenis ------

## 2012-11-20 ENCOUNTER — Ambulatory Visit: Payer: Self-pay

## 2013-01-30 ENCOUNTER — Emergency Department (HOSPITAL_COMMUNITY): Payer: Self-pay

## 2013-01-30 ENCOUNTER — Emergency Department (HOSPITAL_COMMUNITY)
Admission: EM | Admit: 2013-01-30 | Discharge: 2013-01-30 | Disposition: A | Discharge status: Home / Self Care | Payer: Self-pay | Attending: Emergency Medicine | Admitting: Emergency Medicine

## 2013-01-30 ENCOUNTER — Encounter (HOSPITAL_COMMUNITY): Payer: Self-pay | Admitting: Emergency Medicine

## 2013-01-30 DIAGNOSIS — Z8719 Personal history of other diseases of the digestive system: Secondary | ICD-10-CM | POA: Insufficient documentation

## 2013-01-30 DIAGNOSIS — Z3202 Encounter for pregnancy test, result negative: Secondary | ICD-10-CM | POA: Insufficient documentation

## 2013-01-30 DIAGNOSIS — Z79899 Other long term (current) drug therapy: Secondary | ICD-10-CM | POA: Insufficient documentation

## 2013-01-30 DIAGNOSIS — F17200 Nicotine dependence, unspecified, uncomplicated: Secondary | ICD-10-CM | POA: Insufficient documentation

## 2013-01-30 DIAGNOSIS — I10 Essential (primary) hypertension: Secondary | ICD-10-CM | POA: Insufficient documentation

## 2013-01-30 DIAGNOSIS — J45909 Unspecified asthma, uncomplicated: Secondary | ICD-10-CM | POA: Insufficient documentation

## 2013-01-30 DIAGNOSIS — R112 Nausea with vomiting, unspecified: Secondary | ICD-10-CM | POA: Insufficient documentation

## 2013-01-30 DIAGNOSIS — R109 Unspecified abdominal pain: Secondary | ICD-10-CM

## 2013-01-30 DIAGNOSIS — E119 Type 2 diabetes mellitus without complications: Secondary | ICD-10-CM | POA: Insufficient documentation

## 2013-01-30 DIAGNOSIS — R1013 Epigastric pain: Secondary | ICD-10-CM | POA: Insufficient documentation

## 2013-01-30 LAB — COMPREHENSIVE METABOLIC PANEL
ALK PHOS: 59 U/L (ref 39–117)
ALT: 7 U/L (ref 0–35)
AST: 11 U/L (ref 0–37)
Albumin: 3.8 g/dL (ref 3.5–5.2)
BUN: 9 mg/dL (ref 6–23)
CHLORIDE: 99 meq/L (ref 96–112)
CO2: 21 mEq/L (ref 19–32)
Calcium: 9.1 mg/dL (ref 8.4–10.5)
Creatinine, Ser: 0.57 mg/dL (ref 0.50–1.10)
GLUCOSE: 220 mg/dL — AB (ref 70–99)
Potassium: 4.4 mEq/L (ref 3.7–5.3)
Sodium: 135 mEq/L — ABNORMAL LOW (ref 137–147)
Total Bilirubin: 0.3 mg/dL (ref 0.3–1.2)
Total Protein: 7.4 g/dL (ref 6.0–8.3)

## 2013-01-30 LAB — LIPASE, BLOOD: LIPASE: 48 U/L (ref 11–59)

## 2013-01-30 LAB — CBC
HEMATOCRIT: 41.2 % (ref 36.0–46.0)
HEMOGLOBIN: 14.3 g/dL (ref 12.0–15.0)
MCH: 29 pg (ref 26.0–34.0)
MCHC: 34.7 g/dL (ref 30.0–36.0)
MCV: 83.6 fL (ref 78.0–100.0)
Platelets: 306 10*3/uL (ref 150–400)
RBC: 4.93 MIL/uL (ref 3.87–5.11)
RDW: 11.9 % (ref 11.5–15.5)
WBC: 15.5 10*3/uL — ABNORMAL HIGH (ref 4.0–10.5)

## 2013-01-30 LAB — POCT PREGNANCY, URINE: Preg Test, Ur: NEGATIVE

## 2013-01-30 LAB — GLUCOSE, CAPILLARY: GLUCOSE-CAPILLARY: 184 mg/dL — AB (ref 70–99)

## 2013-01-30 LAB — CG4 I-STAT (LACTIC ACID): Lactic Acid, Venous: 1.1 mmol/L (ref 0.5–2.2)

## 2013-01-30 MED ORDER — SODIUM CHLORIDE 0.9 % IV BOLUS (SEPSIS)
1000.0000 mL | Freq: Once | INTRAVENOUS | Status: AC
  Administered 2013-01-30: 1000 mL via INTRAVENOUS

## 2013-01-30 MED ORDER — IOHEXOL 300 MG/ML  SOLN
100.0000 mL | Freq: Once | INTRAMUSCULAR | Status: AC | PRN
  Administered 2013-01-30: 100 mL via INTRAVENOUS

## 2013-01-30 MED ORDER — MORPHINE SULFATE 4 MG/ML IJ SOLN
4.0000 mg | Freq: Once | INTRAMUSCULAR | Status: AC
  Administered 2013-01-30: 4 mg via INTRAVENOUS
  Filled 2013-01-30: qty 1

## 2013-01-30 MED ORDER — OXYCODONE-ACETAMINOPHEN 5-325 MG PO TABS: 1.0000 | ORAL_TABLET | Freq: Four times a day (QID) | ORAL | Status: DC | PRN

## 2013-01-30 MED ORDER — PANTOPRAZOLE SODIUM 20 MG PO TBEC: 20.0000 mg | DELAYED_RELEASE_TABLET | Freq: Every day | ORAL | Status: AC

## 2013-01-30 MED ORDER — ONDANSETRON HCL 4 MG/2ML IJ SOLN
4.0000 mg | Freq: Once | INTRAMUSCULAR | Status: AC
  Administered 2013-01-30: 4 mg via INTRAVENOUS
  Filled 2013-01-30: qty 2

## 2013-01-30 NOTE — ED Notes (Signed)
MD at bedside. 

## 2013-01-30 NOTE — ED Notes (Signed)
The pt is c/o abd for 3 days with nv.  She thinks she may have pancreatitis.  Hx of the same.   She is also c/o a headache lmp jan 1

## 2013-01-30 NOTE — Discharge Instructions (Signed)
Abdominal Pain, Women °Abdominal (stomach, pelvic, or belly) pain can be caused by many things. It is important to tell your doctor: °· The location of the pain. °· Does it come and go or is it present all the time? °· Are there things that start the pain (eating certain foods, exercise)? °· Are there other symptoms associated with the pain (fever, nausea, vomiting, diarrhea)? °All of this is helpful to know when trying to find the cause of the pain. °CAUSES  °· Stomach: virus or bacteria infection, or ulcer. °· Intestine: appendicitis (inflamed appendix), regional ileitis (Crohn's disease), ulcerative colitis (inflamed colon), irritable bowel syndrome, diverticulitis (inflamed diverticulum of the colon), or cancer of the stomach or intestine. °· Gallbladder disease or stones in the gallbladder. °· Kidney disease, kidney stones, or infection. °· Pancreas infection or cancer. °· Fibromyalgia (pain disorder). °· Diseases of the female organs: °· Uterus: fibroid (non-cancerous) tumors or infection. °· Fallopian tubes: infection or tubal pregnancy. °· Ovary: cysts or tumors. °· Pelvic adhesions (scar tissue). °· Endometriosis (uterus lining tissue growing in the pelvis and on the pelvic organs). °· Pelvic congestion syndrome (female organs filling up with blood just before the menstrual period). °· Pain with the menstrual period. °· Pain with ovulation (producing an egg). °· Pain with an IUD (intrauterine device, birth control) in the uterus. °· Cancer of the female organs. °· Functional pain (pain not caused by a disease, may improve without treatment). °· Psychological pain. °· Depression. °DIAGNOSIS  °Your doctor will decide the seriousness of your pain by doing an examination. °· Blood tests. °· X-rays. °· Ultrasound. °· CT scan (computed tomography, special type of X-ray). °· MRI (magnetic resonance imaging). °· Cultures, for infection. °· Barium enema (dye inserted in the large intestine, to better view it with  X-rays). °· Colonoscopy (looking in intestine with a lighted tube). °· Laparoscopy (minor surgery, looking in abdomen with a lighted tube). °· Major abdominal exploratory surgery (looking in abdomen with a large incision). °TREATMENT  °The treatment will depend on the cause of the pain.  °· Many cases can be observed and treated at home. °· Over-the-counter medicines recommended by your caregiver. °· Prescription medicine. °· Antibiotics, for infection. °· Birth control pills, for painful periods or for ovulation pain. °· Hormone treatment, for endometriosis. °· Nerve blocking injections. °· Physical therapy. °· Antidepressants. °· Counseling with a psychologist or psychiatrist. °· Minor or major surgery. °HOME CARE INSTRUCTIONS  °· Do not take laxatives, unless directed by your caregiver. °· Take over-the-counter pain medicine only if ordered by your caregiver. Do not take aspirin because it can cause an upset stomach or bleeding. °· Try a clear liquid diet (broth or water) as ordered by your caregiver. Slowly move to a bland diet, as tolerated, if the pain is related to the stomach or intestine. °· Have a thermometer and take your temperature several times a day, and record it. °· Bed rest and sleep, if it helps the pain. °· Avoid sexual intercourse, if it causes pain. °· Avoid stressful situations. °· Keep your follow-up appointments and tests, as your caregiver orders. °· If the pain does not go away with medicine or surgery, you may try: °· Acupuncture. °· Relaxation exercises (yoga, meditation). °· Group therapy. °· Counseling. °SEEK MEDICAL CARE IF:  °· You notice certain foods cause stomach pain. °· Your home care treatment is not helping your pain. °· You need stronger pain medicine. °· You want your IUD removed. °· You feel faint or   lightheaded.  You develop nausea and vomiting.  You develop a rash.  You are having side effects or an allergy to your medicine. SEEK IMMEDIATE MEDICAL CARE IF:   Your  pain does not go away or gets worse.  You have a fever.  Your pain is felt only in portions of the abdomen. The right side could possibly be appendicitis. The left lower portion of the abdomen could be colitis or diverticulitis.  You are passing blood in your stools (bright red or black tarry stools, with or without vomiting).  You have blood in your urine.  You develop chills, with or without a fever.  You pass out. MAKE SURE YOU:   Understand these instructions.  Will watch your condition.  Will get help right away if you are not doing well or get worse. Document Released: 10/21/2006 Document Revised: 03/18/2011 Document Reviewed: 11/10/2008 [facility] Patient Information 2014 [place], [place].   Emergency Department Resource Guide 1) Find a Doctor and Pay Out of Pocket Although you won't have to find out who is covered by your insurance plan, it is a good idea to ask around and get recommendations. You will then need to call the office and see if the doctor you have chosen will accept you as a new patient and what types of options they offer for patients who are self-pay. Some doctors offer discounts or will set up payment plans for their patients who do not have insurance, but you will need to ask so you aren't surprised when you get to your appointment.  2) Contact Your Local Health Department Not all health departments have doctors that can see patients for sick visits, but many do, so it is worth a call to see if yours does. If you don't know where your local health department is, you can check in your phone book. The CDC also has a tool to help you locate your state's health department, and many state websites also have listings of all of their local health departments.  3) Find a [facility] If your illness is not likely to be very severe or complicated, you may want to try a walk in clinic. These are popping up all over the country in pharmacies, drugstores, and shopping  centers. They're usually staffed by nurse practitioners or physician assistants that have been trained to treat common illnesses and complaints. They're usually fairly quick and inexpensive. However, if you have serious medical issues or chronic medical problems, these are probably not your best option.  No Primary Care Doctor: - Call Health Connect at  [phone] - they can help you locate a primary care doctor that  accepts your insurance, provides certain services, etc. - Physician Referral Service- [phone]  Chronic Pain Problems: Organization         Address  Phone   Notes  [facility]  [phone] Patients need to be referred by their primary care doctor.   Medication Assistance: Organization         Address  Phone   Notes  [facility] Medication [facility] [address] [place], [place] [address] [phone] --Must be a resident of [facility] -- Must have NO insurance coverage whatsoever (no Medicaid/ Medicare, etc.) -- The pt. MUST have a primary care doctor that directs their care regularly and follows them in the community   MedAssist  [phone]   [employer]  [phone]    Agencies that provide inexpensive medical care:  Organization         Address  Phone   Notes  [place]  [phone]   [facility]    [phone]   [facility] [place], [place] [address] [phone]   [place] [address], [place] [phone]   Planned Parenthood    [phone]   [facility]    [phone]   [place] and [place] [address], [place] Phone:  [phone], Fax:  [phone] Hours of Operation:  9 am - 6 pm, M-F.  Also accepts Medicaid/Medicare and self-pay.  [facility] for [place] [address], [address], [place] Phone: [phone], Fax: [phone]. Hours of Operation:  8:30 am - 5:30 pm, M-F.  Also accepts Medicaid and self-pay.  [facility] [address] [place] Phone: [phone]   [place], [place], [place] [phone], Ext. [phone] Mondays & Thursdays: 7-9 AM.  First 15 patients are seen on a first come, first serve basis.    [place] Providers:  Organization         Address  Phone   Notes  [facility] [address], [place] [phone] Also accepts self-pay patients.  [facility] [address], [place]  [phone]   [place], [address], [place] [phone]   [facility] [address] [place] [phone]   Lucianne Lei [address], [place]   [phone] Only accepts [place] Access [place] patients after they have their name applied to their card.   Self-Pay (no insurance) in [facility]:  Organization         Address  Phone   Notes  Sickle Cell Patients, [facility] Internal [facility] [place] [phone]   [facility] [place] [phone]   [facility] [place]  [place], [place], [place] [phone]   [facility]/Dr. Osei-Bonsu  [address] [place] or [address], [place] [phone] Phone number for both [place] and [place] locations is the same.  [facility] [address] [place] [phone]   [facility] [address] [place] or [address] [phone]   [facility] [address] [place] [phone], phone; [phone], fax Sees patients 1st and 3rd Saturday of every month.  Must not qualify for public or private insurance (i.e.  Medicaid, Medicare, [place] Health Choice, Veterans' Benefits)  Household income should be no more than 200% of the poverty level The clinic cannot treat you if you are pregnant or think you are pregnant  Sexually transmitted diseases are not treated at the clinic.    Dental Care: Organization         Address  Phone  Notes  [facility] Department of [facility] [place] [phone] Accepts children up to age 71 who are enrolled in [place] or [place]; pregnant women with a Medicaid card; and children who have applied for Medicaid or  Health Choice, but were declined,  whose parents can pay a reduced fee at time of service.  [facility] Department of [facility]  [address], [place] [phone] Accepts children up to age 1 who are enrolled in [place] or [place]; pregnant women with a Medicaid card; and children who have applied for Medicaid or [place] Health Choice, but were declined, whose parents can pay a reduced fee at time of service.  [facility] PRO[facility]  [place] [phone] Patients are seen by appointment only. Walk-ins are not accepted. [place] will see patients 76 years of age and older. Monday - Tuesday (8am-5pm) Most Wednesdays (8:30-5pm) $30 per visit, cash only  [facility] Adult Dental Access PROGRAM  [address], [facility] [phone] Patients are seen by appointment only. Walk-ins are not accepted. [place] will see patients 42 years of age and older. One Wednesday Evening (Monthly: Volunteer Based).  $30 per visit, cash only  [place]  [phone] for adults; Children under age 46, call Graduate Pediatric Dentistry at [phone]. Children aged 75-14, please call [phone] to request a pediatric application.  Dental services are provided in all areas of dental care including fillings,  crowns and bridges, complete and partial dentures, implants, gum treatment, root canals, and extractions. Preventive care is also provided. Treatment is provided to both adults and children. Patients are selected via a lottery and there is often a waiting list.   [facility] [address] [place]  [phone] [email or web address]   [facility] [address] [place], [place] [phone], Ext. [phone] Second and Fourth Thursday of each month, opens at 6:30 AM; Clinic ends at 9 AM.  Patients are seen on a first-come first-served basis, and a limited number are seen during each clinic.   [facility]  [address] Hillard Danker [place], [place] [phone]   Eligibility Requirements You must have lived in [place], [place], or [place] counties for at least the last three months.   You cannot be eligible for state or federal sponsored [employer], including [employer], [place], or [employer].   You generally cannot be eligible for healthcare insurance through your employer.    How to apply: Eligibility screenings are held every Tuesday and Wednesday afternoon from 1:00 pm until 4:00 pm. You do not need an appointment for the interview!  [facility] [address] [place], [place]   [place]  [place] Department  Force  [phone]    Behavioral Health Resources in the Community: Intensive Outpatient Programs Organization         Address  Phone  Notes  [place]. [address] [place], [place] [phone]   [facility] Outpatient [address] [place], [place]   ADS: Alcohol & Drug Svcs [address], [place], [place]   [place] [address]  [place], [place] or [phone]   Substance Abuse  Resources Organization         Address  Phone  Notes  Alcohol and Drug Services  [phone]   [place]  [phone]   [facility] [place]   Chinita Pester  [phone]   Residential & Outpatient Substance Abuse Program  [phone]   Psychological Services Organization  Address  Phone  Notes  [place]  [place]  [phone]   [place] [address] [place] or [phone]    Mobile Crisis Teams Organization         Address  Phone  Notes  [facility], Mobile Crisis Care Unit  [phone]   Assertive Psychotherapeutic Services  [address] [place], [place]   [place] [address] [place] [phone]    Self-Help/Support Groups Organization         Address  Phone             Notes  [place] of [place] - variety of support groups  [place] Call for more information  Narcotics Anonymous (NA), [facility] [address], [employer] [place]  2 meetings at this location   Special educational needs teacher         Address  Phone  Notes  [facility] Residential Treatment [place],    [place]  [phone]   [facility]  [address] [place] [phone], [place], [place]   [place], [place] [phone] Admissions: 8am-3pm M-F  Incentives Substance [place] [address],    [place], [place] [phone]   [facility] [address] [place], [place], [place]   [facility] [address],  [place], [place]   [facility] Programs - Intensive Outpatient [address] [place]een [address], [place], [place]   [facility] ([place].,  [place], [place] [phone] or [phone]   [facility]idential Treatment Services (RTS) [address], [place], [place] Accepts Medicaid  [facility] [place] [address]  [place] [phone] Substance Abuse/Addiction Treatment   [facility] Organization         Address  Phone  Notes  [facility]  [phone]   Domenic Schwab, PhD [address] Arlis [place], [place]   [phone] or [phone]   [place], [place] [phone]   [facility] [address] [place], [place] [phone] Insurance/Medicaid/sponsorship through [facility] and Families [address]                                    [place], [place] [phone] [place] [address], [place] [phone]    Dr. Adele Schilder  [phone]   [facility] [place] Dept. 1) [address] [place] One 2) [place] 3)  [place] [address], [place] [phone]  [phone]   [place] [phone] or [phone] (After Hours)

## 2013-01-30 NOTE — ED Provider Notes (Signed)
CSN: [id number]     Arrival date & time 01/30/13  1525 History   First MD Initiated Contact with Patient 01/30/13 1531     Chief Complaint  Patient presents with  . Abdominal Pain   (Consider location/radiation/quality/duration/timing/severity/associated sxs/prior Treatment) Patient is a 33 y.o. female presenting with abdominal pain. The history is provided by the patient.  Abdominal Pain Pain location:  Epigastric Pain quality: sharp   Pain radiates to:  Back Pain severity:  Moderate Onset quality:  Sudden Duration:  3 days Timing:  Constant Progression:  Worsening Chronicity:  New Context: not alcohol use, not eating, not previous surgeries, not recent illness, not retching, not sick contacts and not trauma   Relieved by:  Nothing Worsened by:  Nothing tried Associated symptoms: nausea and vomiting   Associated symptoms: no cough, no diarrhea, no fever and no shortness of breath     Past Medical History  Diagnosis Date  . Diabetes mellitus   . Asthma   . Hypertension   . Migraine    Past Surgical History  Procedure Laterality Date  . No past surgeries     Family History  Problem Relation Age of Onset  . Hypertension Mother   . Diabetes Father    History  Substance Use Topics  . Smoking status: Current Every Day Smoker -- 0.50 packs/day  . Smokeless tobacco: Not on file  . Alcohol Use: Yes     Comment: occasion   OB History   Grav Para Term Preterm Abortions TAB SAB Ect Mult Living   0              Review of Systems  Constitutional: Negative for fever.  Respiratory: Negative for cough and shortness of breath.   Gastrointestinal: Positive for nausea, vomiting and abdominal pain. Negative for diarrhea.  All other systems reviewed and are negative.    Allergies  Review of patient's allergies indicates no known allergies.  Home Medications   Current Outpatient Rx  Name  Route  Sig  Dispense  Refill  . fluconazole (DIFLUCAN) 150 MG tablet   Oral    Take 1 tablet (150 mg total) by mouth once.   5 tablet   2   . glyBURIDE (DIABETA) 2.5 MG tablet   Oral   Take 2 tablets (5 mg total) by mouth daily with breakfast.   30 tablet   0   . metFORMIN (GLUCOPHAGE) 1000 MG tablet   Oral   Take 0.5 tablets (500 mg total) by mouth 2 (two) times daily with a meal.   60 tablet   0   . OVER THE COUNTER MEDICATION   Vaginal   Place 1 application vaginally 2 (two) times daily as needed (for yeast infection). Over the counter anti-fungal cream          BP 143/95  Pulse 100  Temp(Src) 98.6 F (37 C) (Oral)  Resp 16  SpO2 96%  LMP 01/07/2013 Physical Exam  Nursing note and vitals reviewed. Constitutional: She is oriented to person, place, and time. She appears well-developed and well-nourished. No distress.  HENT:  Head: Normocephalic and atraumatic.  Eyes: EOM are normal. Pupils are equal, round, and reactive to light.  Neck: Normal range of motion. Neck supple.  Cardiovascular: Normal rate and regular rhythm.  Exam reveals no friction rub.   No murmur heard. Pulmonary/Chest: Effort normal and breath sounds normal. No respiratory distress. She has no wheezes. She has no rales.  Abdominal: Soft. She exhibits no distension.  There is tenderness (epigastric. No RUQ pain). There is no rebound.  Musculoskeletal: Normal range of motion. She exhibits no edema.  Neurological: She is alert and oriented to person, place, and time. No cranial nerve deficit. She exhibits normal muscle tone. Coordination normal.  Skin: No rash noted. She is not diaphoretic.    ED Course  Procedures (including critical care time) Labs Review Labs Reviewed  CBC - Abnormal; Notable for the following:    WBC 15.5 (*)    All other components within normal limits  COMPREHENSIVE METABOLIC PANEL - Abnormal; Notable for the following:    Sodium 135 (*)    Glucose, Bld 220 (*)    All other components within normal limits  GLUCOSE, CAPILLARY - Abnormal; Notable for  the following:    Glucose-Capillary 184 (*)    All other components within normal limits  LIPASE, BLOOD  CG4 I-STAT (LACTIC ACID)  POCT PREGNANCY, URINE   Imaging Review Ct Abdomen Pelvis W Contrast  01/30/2013   CLINICAL DATA:  Upper abdominal pain. Weight loss and nausea and vomiting  EXAM: CT ABDOMEN AND PELVIS WITH CONTRAST  TECHNIQUE: Multidetector CT imaging of the abdomen and pelvis was performed using the standard protocol following bolus administration of intravenous contrast.  CONTRAST:  133mL OMNIPAQUE IOHEXOL 300 MG/ML  SOLN  COMPARISON:  08/10/2011  FINDINGS: There is no pleural effusion identified. The lung bases appear clear.  There is no focal liver abnormality identified. The gallbladder appears normal. No biliary dilatation. Normal appearance of the pancreas. The spleen appears normal.  The adrenal glands are both unremarkable. The kidneys are both normal. The urinary bladder appears normal for degree of distention. Fibroid uterus is identified. The largest fibroid arises from the posterior myometrium and measures approximately 6.7 cm, image 77/series 2. There is a collapsed/ruptured corpus luteal cyst within the right ovary. A moderate amount of intermediate attenuation fluid is identified within the pelvis as well as around the inferior aspect of the liver.  Normal caliber of the abdominal aorta. There is no aneurysm. No pelvic or inguinal adenopathy identified.  The stomach appears normal. The small bowel loops have a normal course and caliber and there is no obstruction. The appendix is visualized and appears normal. Normal appearance of the colon. No focal fluid collections identified.  Review of the visualized bony structures is significant for scoliosis and L5-S1 degenerative disc disease.  IMPRESSION: 1. There is moderate intermediate attenuation fluid noted within the pelvis. If there is no history of trauma this finding is favored to represent sequelae of ruptured ovarian cyst. A  collapsed cyst is noted within the right ovary. Careful clinical correlation is advised. 2. Fibroid uterus. 3. No evidence for acute pancreatitis.   Electronically Signed   By: Kerby Moors M.D.   On: 01/30/2013 17:49    EKG Interpretation   None       MDM   1. Abdominal pain    55F presents with epigastric pain. Intermittent for past 3 days. Hx of pancreatitis, diabetes. Similar to prior pancreatitis pain. Associated nausea and vomiting. Patient here with stable vitals. Epigastric pain on exam, no lower abdominal pain, no RUQ or LUQ tenderness. Will check labs, give fluids.  Labs normal. Persistent pain, CT ordered.  CT negative for pancreatitis. She does have free fluid in her pelvis. Denied trauma inititally, denied trauma again upon repeat questioning. Fluid likely for ruptured ovarian cyst. Unclear etiology of her pain. Given GI f/u, put on pain meds and PPI.  Osvaldo Shipper, MD 01/30/13 [phone]

## 2014-04-23 ENCOUNTER — Encounter (HOSPITAL_COMMUNITY): Payer: Self-pay | Admitting: *Deleted

## 2014-04-23 ENCOUNTER — Emergency Department (HOSPITAL_COMMUNITY): Payer: Self-pay

## 2014-04-23 ENCOUNTER — Emergency Department (HOSPITAL_COMMUNITY)
Admission: EM | Admit: 2014-04-23 | Discharge: 2014-04-23 | Disposition: A | Discharge status: Home / Self Care | Payer: Self-pay | Attending: Emergency Medicine | Admitting: Emergency Medicine

## 2014-04-23 DIAGNOSIS — G43909 Migraine, unspecified, not intractable, without status migrainosus: Secondary | ICD-10-CM | POA: Insufficient documentation

## 2014-04-23 DIAGNOSIS — E1165 Type 2 diabetes mellitus with hyperglycemia: Secondary | ICD-10-CM | POA: Insufficient documentation

## 2014-04-23 DIAGNOSIS — Z79899 Other long term (current) drug therapy: Secondary | ICD-10-CM | POA: Insufficient documentation

## 2014-04-23 DIAGNOSIS — Z794 Long term (current) use of insulin: Secondary | ICD-10-CM | POA: Insufficient documentation

## 2014-04-23 DIAGNOSIS — Z3202 Encounter for pregnancy test, result negative: Secondary | ICD-10-CM | POA: Insufficient documentation

## 2014-04-23 DIAGNOSIS — Z720 Tobacco use: Secondary | ICD-10-CM | POA: Insufficient documentation

## 2014-04-23 DIAGNOSIS — J45909 Unspecified asthma, uncomplicated: Secondary | ICD-10-CM | POA: Insufficient documentation

## 2014-04-23 DIAGNOSIS — M533 Sacrococcygeal disorders, not elsewhere classified: Secondary | ICD-10-CM | POA: Insufficient documentation

## 2014-04-23 DIAGNOSIS — I10 Essential (primary) hypertension: Secondary | ICD-10-CM | POA: Insufficient documentation

## 2014-04-23 LAB — COMPREHENSIVE METABOLIC PANEL
ALBUMIN: 3.8 g/dL (ref 3.5–5.2)
ALT: 11 U/L (ref 0–35)
AST: 14 U/L (ref 0–37)
Alkaline Phosphatase: 50 U/L (ref 39–117)
Anion gap: 10 (ref 5–15)
BUN: 12 mg/dL (ref 6–23)
CHLORIDE: 100 mmol/L (ref 96–112)
CO2: 26 mmol/L (ref 19–32)
CREATININE: 0.72 mg/dL (ref 0.50–1.10)
Calcium: 9.6 mg/dL (ref 8.4–10.5)
GFR calc Af Amer: >90 mL/min (ref >90)
GFR calc non Af Amer: >90 mL/min (ref >90)
GLUCOSE: 178 mg/dL — AB (ref 70–99)
Potassium: 4.3 mmol/L (ref 3.5–5.1)
Sodium: 136 mmol/L (ref 135–145)
Total Bilirubin: 0.6 mg/dL (ref 0.3–1.2)
Total Protein: 6.8 g/dL (ref 6.0–8.3)

## 2014-04-23 LAB — URINE MICROSCOPIC-ADD ON

## 2014-04-23 LAB — CBC WITH DIFFERENTIAL/PLATELET
BASOS PCT: 0 % (ref 0–1)
Basophils Absolute: 0 10*3/uL (ref 0.0–0.1)
EOS ABS: 0.4 10*3/uL (ref 0.0–0.7)
EOS PCT: 3 % (ref 0–5)
HCT: 40.2 % (ref 36.0–46.0)
HEMOGLOBIN: 13.2 g/dL (ref 12.0–15.0)
Lymphocytes Relative: 30 % (ref 12–46)
Lymphs Abs: 4 10*3/uL (ref 0.7–4.0)
MCH: 27 pg (ref 26.0–34.0)
MCHC: 32.8 g/dL (ref 30.0–36.0)
MCV: 82.4 fL (ref 78.0–100.0)
MONOS PCT: 6 % (ref 3–12)
Monocytes Absolute: 0.8 10*3/uL (ref 0.1–1.0)
Neutro Abs: 8.3 10*3/uL — ABNORMAL HIGH (ref 1.7–7.7)
Neutrophils Relative %: 61 % (ref 43–77)
Platelets: 357 10*3/uL (ref 150–400)
RBC: 4.88 MIL/uL (ref 3.87–5.11)
RDW: 12.4 % (ref 11.5–15.5)
WBC: 13.5 10*3/uL — ABNORMAL HIGH (ref 4.0–10.5)

## 2014-04-23 LAB — URINALYSIS, ROUTINE W REFLEX MICROSCOPIC
Bilirubin Urine: NEGATIVE
Glucose, UA: >1000 mg/dL — AB
Hgb urine dipstick: NEGATIVE
KETONES UR: NEGATIVE mg/dL
Leukocytes, UA: NEGATIVE
Nitrite: NEGATIVE
PROTEIN: NEGATIVE mg/dL
Specific Gravity, Urine: 1.027 (ref 1.005–1.030)
UROBILINOGEN UA: 0.2 mg/dL (ref 0.0–1.0)
pH: 5.5 (ref 5.0–8.0)

## 2014-04-23 NOTE — ED Notes (Signed)
Pt states that she fell a few months ago, and has been having pain in her coccyx and lower back ever since. Pt states that she takes insulin and metformin, but does not check her sugar. Pt is unsure what her sugar was before she got here. Pt reports weakness, malaise, and generally not feeling well since yesterday.

## 2014-04-23 NOTE — Discharge Instructions (Signed)
Please take your medications as prescribed.  No fractures or other abnormaliites were noted on your xray.  Your blood sugar was elevated.  It is important to follow a good diabetic diet and monitor your blood sugars.   Diabetes and Standards of Medical Care Diabetes is complicated. You may find that your diabetes team includes a dietitian, nurse, diabetes educator, eye doctor, and more. To help everyone know what is going on and to help you get the care you deserve, the following schedule of care was developed to help keep you on track. Below are the tests, exams, vaccines, medicines, education, and plans you will need. HbA1c test This test shows how well you have controlled your glucose over the past 2-3 months. It is used to see if your diabetes management plan needs to be adjusted.   It is performed at least 2 times a year if you are meeting treatment goals.  It is performed 4 times a year if therapy has changed or if you are not meeting treatment goals. Blood pressure test  This test is performed at every routine medical visit. The goal is less than 140/90 mm Hg for most people, but 130/80 mm Hg in some cases. Ask your health care provider about your goal. Dental exam  Follow up with the dentist regularly. Eye exam  If you are diagnosed with type 1 diabetes as a child, get an exam upon reaching the age of 61 years or older and have had diabetes for 3-5 years. Yearly eye exams are recommended after that initial eye exam.  If you are diagnosed with type 1 diabetes as an adult, get an exam within 5 years of diagnosis and then yearly.  If you are diagnosed with type 2 diabetes, get an exam as soon as possible after the diagnosis and then yearly. Foot care exam  Visual foot exams are performed at every routine medical visit. The exams check for cuts, injuries, or other problems with the feet.  A comprehensive foot exam should be done yearly. This includes visual inspection as well as  assessing foot pulses and testing for loss of sensation.  Check your feet nightly for cuts, injuries, or other problems with your feet. Tell your health care provider if anything is not healing. Kidney function test (urine microalbumin)  This test is performed once a year.  Type 1 diabetes: The first test is performed 5 years after diagnosis.  Type 2 diabetes: The first test is performed at the time of diagnosis.  A serum creatinine and estimated glomerular filtration rate (eGFR) test is done once a year to assess the level of chronic kidney disease (CKD), if present. Lipid profile (cholesterol, HDL, LDL, triglycerides)  Performed every 5 years for most people.  The goal for LDL is less than 100 mg/dL. If you are at high risk, the goal is less than 70 mg/dL.  The goal for HDL is 40 mg/dL-50 mg/dL for men and 50 mg/dL-60 mg/dL for women. An HDL cholesterol of 60 mg/dL or higher gives some protection against heart disease.  The goal for triglycerides is less than 150 mg/dL. Influenza vaccine, pneumococcal vaccine, and hepatitis B vaccine  The influenza vaccine is recommended yearly.  It is recommended that people with diabetes who are over 31 years old get the pneumonia vaccine. In some cases, two separate shots may be given. Ask your health care provider if your pneumonia vaccination is up to date.  The hepatitis B vaccine is also recommended for adults with  diabetes. Diabetes self-management education  Education is recommended at diagnosis and ongoing as needed. Treatment plan  Your treatment plan is reviewed at every medical visit. Document Released: 10/21/2008 Document Revised: 05/10/2013 Document Reviewed: 05/26/2012 [facility] Patient Information 2015 [place], [place]. This information is not intended to replace advice given to you by your health care provider. Make sure you discuss any questions you have with your health care provider.  Diabetes Mellitus and Food It is  important for you to manage your blood sugar (glucose) level. Your blood glucose level can be greatly affected by what you eat. Eating healthier foods in the appropriate amounts throughout the day at about the same time each day will help you control your blood glucose level. It can also help slow or prevent worsening of your diabetes mellitus. Healthy eating may even help you improve the level of your blood pressure and reach or maintain a healthy weight.  HOW CAN FOOD AFFECT ME? Carbohydrates Carbohydrates affect your blood glucose level more than any other type of food. Your dietitian will help you determine how many carbohydrates to eat at each meal and teach you how to count carbohydrates. Counting carbohydrates is important to keep your blood glucose at a healthy level, especially if you are using insulin or taking certain medicines for diabetes mellitus. Alcohol Alcohol can cause sudden decreases in blood glucose (hypoglycemia), especially if you use insulin or take certain medicines for diabetes mellitus. Hypoglycemia can be a life-threatening condition. Symptoms of hypoglycemia (sleepiness, dizziness, and disorientation) are similar to symptoms of having too much alcohol.  If your health care provider has given you approval to drink alcohol, do so in moderation and use the following guidelines:  Women should not have more than one drink per day, and men should not have more than two drinks per day. One drink is equal to:  12 oz of beer.  5 oz of wine.  1 oz of hard liquor.  Do not drink on an empty stomach.  Keep yourself hydrated. Have water, diet soda, or unsweetened iced tea.  Regular soda, juice, and other mixers might contain a lot of carbohydrates and should be counted. WHAT FOODS ARE NOT RECOMMENDED? As you make food choices, it is important to remember that all foods are not the same. Some foods have fewer nutrients per serving than other foods, even though they might have the  same number of calories or carbohydrates. It is difficult to get your body what it needs when you eat foods with fewer nutrients. Examples of foods that you should avoid that are high in calories and carbohydrates but low in nutrients include:  Trans fats (most processed foods list trans fats on the Nutrition Facts label).  Regular soda.  Juice.  Candy.  Sweets, such as cake, pie, doughnuts, and cookies.  Fried foods. WHAT FOODS CAN I EAT? Have nutrient-rich foods, which will nourish your body and keep you healthy. The food you should eat also will depend on several factors, including:  The calories you need.  The medicines you take.  Your weight.  Your blood glucose level.  Your blood pressure level.  Your cholesterol level. You also should eat a variety of foods, including:  Protein, such as meat, poultry, fish, tofu, nuts, and seeds (lean animal proteins are best).  Fruits.  Vegetables.  Dairy products, such as milk, cheese, and yogurt (low fat is best).  Breads, grains, pasta, cereal, rice, and beans.  Fats such as olive oil, trans fat-free margarine, canola  oil, avocado, and olives. DOES EVERYONE WITH DIABETES MELLITUS HAVE THE SAME MEAL PLAN? Because every person with diabetes mellitus is different, there is not one meal plan that works for everyone. It is very important that you meet with a dietitian who will help you create a meal plan that is just right for you. Document Released: 09/20/2004 Document Revised: 12/29/2012 Document Reviewed: 11/20/2012 [facility] Patient Information 2015 [place], [place]. This information is not intended to replace advice given to you by your health care provider. Make sure you discuss any questions you have with your health care provider.  How to Avoid Diabetes Problems You can do a lot to prevent or slow down diabetes problems. Following your diabetes plan and taking care of yourself can reduce your risk of serious or  life-threatening complications. Below, you will find certain things you can do to prevent diabetes problems. MANAGE YOUR DIABETES Follow your health care provider's, nurse educator's, and dietitian's instructions for managing your diabetes. They will teach you the basics of diabetes care. They can help answer questions you may have. Learn about diabetes and make healthy choices regarding eating and physical activity. Monitor your blood glucose level regularly. Your health care provider will help you decide how often to check your blood glucose level depending on your treatment goals and how well you are meeting them.  DO NOT USE NICOTINE Nicotine and diabetes are a dangerous combination. Nicotine raises your risk for diabetes problems. If you quit using nicotine, you will lower your risk for heart attack, stroke, nerve disease, and kidney disease. Your cholesterol and your blood pressure levels may improve. Your blood circulation will also improve. Do not use any tobacco products, including cigarettes, chewing tobacco, or electronic cigarettes. If you need help quitting, ask your health care provider. KEEP YOUR BLOOD PRESSURE UNDER CONTROL Keeping your blood pressure under control will help prevent damage to your eyes, kidneys, heart, and blood vessels. Blood pressure consists of two numbers. The top number should be below 120, and the bottom number should be below 80 (120/80). Keep your blood pressure as close to these numbers as you can. If you already have kidney disease, you may want even lower blood pressure to protect your kidneys. Talk to your health care provider to make sure that your blood pressure goal is right for your needs. Meal planning, medicines, and exercise can help you reach your blood pressure target. Have your blood pressure checked at every visit with your health care provider. KEEP YOUR CHOLESTEROL UNDER CONTROL Normal cholesterol levels will help prevent heart disease and stroke.  These are the biggest health problems for people with diabetes. Keeping cholesterol levels under control can also help with blood flow. Have your cholesterol level checked at least once a year. Your health care provider may prescribe a medicine known as a statin. Statins lower your cholesterol. If you are not taking a statin, ask your health care provider if you should be. Meal planning, exercise, and medicines can help you reach your cholesterol targets.  SCHEDULE AND KEEP YOUR ANNUAL PHYSICAL EXAMS AND EYE EXAMS Your health care provider will tell you how often he or she wants to see you depending on your plan of treatment. It is important that you keep these appointments so that possible problems can be identified early and complications can be avoided or treated.  Every visit with your health care provider should include your weight, blood pressure, and an evaluation of your blood glucose control.  Your hemoglobin A1c should  be checked:  At least twice a year if you are at your goal.  Every 3 months if there are changes in treatment.  If you are not meeting your goals.  Your blood lipids should be checked yearly. You should also be checked yearly to see if you have protein in your urine (microalbumin).  Schedule a dilated eye exam within 5 years of your diagnosis if you have type 1 diabetes, and then yearly. Schedule a dilated eye exam at diagnosis if you have type 2 diabetes, and then yearly. All exams thereafter can be extended to every 2 to 3 years if one or more exams have been normal. KEEP YOUR VACCINES CURRENT The flu vaccine is recommended yearly. The formula for the vaccine changes every year and needs to be updated for the best protection against current viruses. It is recommended that people with diabetes who are over 31 years old get the pneumonia vaccine. In some cases, two separate shots may be given. Ask your health care provider if your pneumonia vaccination is up-to-date.  However, there are some instances where another vaccine is recommended. Check with your health care provider. TAKE CARE OF YOUR FEET  Diabetes may cause you to have a poor blood supply (circulation) to your legs and feet. Because of this, the skin may be thinner, break easier, and heal more slowly. You also may have nerve damage in your legs and feet, causing decreased feeling. You may not notice minor injuries to your feet that could lead to serious problems or infections. Taking care of your feet is very important. Visual foot exams are performed at every routine medical visit. The exams check for cuts, injuries, or other problems with the feet. A comprehensive foot exam should be done yearly. This includes visual inspection as well as assessing foot pulses and testing for loss of sensation. You should also do the following:  Inspect your feet daily for cuts, calluses, blisters, ingrown toenails, and signs of infection, such as redness, swelling, or pus.  Wash and dry your feet thoroughly, especially between the toes.  Avoid soaking your feet regularly in hot water baths.  Moisturize dry skin with lotion, avoiding areas between your toes.  Cut toenails straight across and file the edges.  Avoid shoes that do not fit well or have areas that irritate your skin.  Avoid going barefooted or wearing only socks. Your feet need protection. TAKE CARE OF YOUR TEETH People with poorly controlled diabetes are more likely to have gum (periodontal) disease. These infections make diabetes harder to control. Periodontal diseases, if left untreated, can lead to tooth loss. Brush your teeth twice a day, floss, and see your dentist for checkups and cleaning every 6 months, or 2 times a year. ASK YOUR HEALTH CARE PROVIDER ABOUT TAKING ASPIRIN Taking aspirin daily is recommended to help prevent cardiovascular disease in people with and without diabetes. Ask your health care provider if this would benefit you and  what dose he or she would recommend. DRINK RESPONSIBLY Moderate amounts of alcohol (less than 1 drink per day for adult women and less than 2 drinks per day for adult men) have a minimal effect on blood glucose if ingested with food. It is important to eat food with alcohol to avoid hypoglycemia. People should avoid alcohol if they have a history of alcohol abuse or dependence, if they are pregnant, and if they have liver disease, pancreatitis, advanced neuropathy, or severe hypertriglyceridemia. LESSEN STRESS Living with diabetes can be stressful. When  you are under stress, your blood glucose may be affected in two ways:  Stress hormones may cause your blood glucose to rise.  You may be distracted from taking good care of yourself. It is a good idea to be aware of your stress level and make changes that are necessary to help you better manage challenging situations. Support groups, planned relaxation, a hobby you enjoy, meditation, healthy relationships, and exercise all work to lower your stress level. If your efforts do not seem to be helping, get help from your health care provider or a trained mental health professional. Document Released: 09/11/2010 Document Revised: 05/10/2013 Document Reviewed: 02/17/2013 [facility] Patient Information 2015 [place], [place]. This information is not intended to replace advice given to you by your health care provider. Make sure you discuss any questions you have with your health care provider.  Pain of Unknown Etiology (Pain Without a Known Cause) You have come to your caregiver because of pain. Pain can occur in any part of the body. Often there is not a definite cause. If your laboratory (blood or urine) work was normal and X-rays or other studies were normal, your caregiver may treat you without knowing the cause of the pain. An example of this is the headache. Most headaches are diagnosed by taking a history. This means your caregiver asks you questions about  your headaches. Your caregiver determines a treatment based on your answers. Usually testing done for headaches is normal. Often testing is not done unless there is no response to medications. Regardless of where your pain is located today, you can be given medications to make you comfortable. If no physical cause of pain can be found, most cases of pain will gradually leave as suddenly as they came.  If you have a painful condition and no reason can be found for the pain, it is important that you follow up with your caregiver. If the pain becomes worse or does not go away, it may be necessary to repeat tests and look further for a possible cause.  Only take over-the-counter or prescription medicines for pain, discomfort, or fever as directed by your caregiver.  For the protection of your privacy, test results cannot be given over the phone. Make sure you receive the results of your test. Ask how these results are to be obtained if you have not been informed. It is your responsibility to obtain your test results.  You may continue all activities unless the activities cause more pain. When the pain lessens, it is important to gradually resume normal activities. Resume activities by beginning slowly and gradually increasing the intensity and duration of the activities or exercise. During periods of severe pain, bed rest may be helpful. Lie or sit in any position that is comfortable.  Ice used for acute (sudden) conditions may be effective. Use a large plastic bag filled with ice and wrapped in a towel. This may provide pain relief.  See your caregiver for continued problems. Your caregiver can help or refer you for exercises or physical therapy if necessary. If you were given medications for your condition, do not drive, operate machinery or power tools, or sign legal documents for 24 hours. Do not drink alcohol, take sleeping pills, or take other medications that may interfere with treatment. See your  caregiver immediately if you have pain that is becoming worse and not relieved by medications. Document Released: 09/18/2000 Document Revised: 10/14/2012 Document Reviewed: 12/24/2004 [facility] Patient Information 2015 [place], [place]. This information is not intended  to replace advice given to you by your health care provider. Make sure you discuss any questions you have with your health care provider.  Tailbone Injury The tailbone is the small bone at the lower end of the backbone (spine). You may have stretched tissues, bruises, or a broken bone (fracture). Most tailbone injuries get better on their own after 4 to 6 weeks. HOME CARE  Put ice on the injured area.  Put ice in a plastic bag.  Place a towel between your skin and the bag.  Leave the ice on for 15-20 minutes. Do this every hour while you are awake for 1 to 2 days.  Sit on a large, rubber or inflated ring or cushion to lessen pain. Lean forward when you sit to help lessen pain.  Avoid sitting in one place for a long time.  Increase your activity as the pain allows.  Only take medicines as told by your doctor.  You can take medicine to help you poop (stool softeners) if it is painful to poop.  Eat foods with plenty of fiber.  Keep all doctor visits as told. GET HELP RIGHT AWAY IF:  Your pain gets worse.  Pooping causes you pain.  You cannot poop (constipation).  You have a fever. MAKE SURE YOU:  Understand these instructions.  Will watch your condition.  Will get help right away if you are not doing well or get worse. Document Released: 01/26/2010 Document Revised: 03/18/2011 Document Reviewed: 07/19/2010 [facility] Patient Information 2015 [place], [place]. This information is not intended to replace advice given to you by your health care provider. Make sure you discuss any questions you have with your health care provider.

## 2014-04-23 NOTE — ED Notes (Signed)
Family at bedside. 

## 2014-04-23 NOTE — ED Notes (Signed)
Th pt reports that her blood sugar is high and she has had coccyx pain for 2 months.  She was drinking beer yesterday.  lmp 2 weeks ago

## 2014-04-23 NOTE — ED Notes (Signed)
[id number] 

## 2014-04-23 NOTE — ED Notes (Signed)
Patient transported to X-ray 
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
## 2014-04-25 LAB — POC URINE PREG, ED: Preg Test, Ur: NEGATIVE

## 2014-04-25 LAB — CBG MONITORING, ED: Glucose-Capillary: 163 mg/dL — ABNORMAL HIGH (ref 70–99)

## 2014-06-07 ENCOUNTER — Encounter (HOSPITAL_COMMUNITY): Payer: Self-pay | Admitting: Physical Medicine and Rehabilitation

## 2014-06-07 ENCOUNTER — Emergency Department (HOSPITAL_COMMUNITY): Payer: Managed Care, Other (non HMO)

## 2014-06-07 ENCOUNTER — Emergency Department (HOSPITAL_COMMUNITY)
Admission: EM | Admit: 2014-06-07 | Discharge: 2014-06-07 | Disposition: A | Discharge status: Home / Self Care | Payer: Managed Care, Other (non HMO) | Attending: Emergency Medicine | Admitting: Emergency Medicine

## 2014-06-07 DIAGNOSIS — F17200 Nicotine dependence, unspecified, uncomplicated: Secondary | ICD-10-CM

## 2014-06-07 DIAGNOSIS — G8929 Other chronic pain: Secondary | ICD-10-CM | POA: Insufficient documentation

## 2014-06-07 DIAGNOSIS — Z720 Tobacco use: Secondary | ICD-10-CM | POA: Insufficient documentation

## 2014-06-07 DIAGNOSIS — R0789 Other chest pain: Secondary | ICD-10-CM | POA: Diagnosis not present

## 2014-06-07 DIAGNOSIS — R05 Cough: Secondary | ICD-10-CM | POA: Insufficient documentation

## 2014-06-07 DIAGNOSIS — G43909 Migraine, unspecified, not intractable, without status migrainosus: Secondary | ICD-10-CM | POA: Diagnosis not present

## 2014-06-07 DIAGNOSIS — J45909 Unspecified asthma, uncomplicated: Secondary | ICD-10-CM | POA: Diagnosis not present

## 2014-06-07 DIAGNOSIS — E1165 Type 2 diabetes mellitus with hyperglycemia: Secondary | ICD-10-CM | POA: Diagnosis not present

## 2014-06-07 DIAGNOSIS — R1011 Right upper quadrant pain: Secondary | ICD-10-CM | POA: Diagnosis not present

## 2014-06-07 DIAGNOSIS — E669 Obesity, unspecified: Secondary | ICD-10-CM | POA: Insufficient documentation

## 2014-06-07 DIAGNOSIS — Z79899 Other long term (current) drug therapy: Secondary | ICD-10-CM | POA: Insufficient documentation

## 2014-06-07 DIAGNOSIS — E119 Type 2 diabetes mellitus without complications: Secondary | ICD-10-CM | POA: Diagnosis not present

## 2014-06-07 DIAGNOSIS — Z3202 Encounter for pregnancy test, result negative: Secondary | ICD-10-CM | POA: Insufficient documentation

## 2014-06-07 DIAGNOSIS — I10 Essential (primary) hypertension: Secondary | ICD-10-CM | POA: Insufficient documentation

## 2014-06-07 DIAGNOSIS — M545 Low back pain: Secondary | ICD-10-CM | POA: Insufficient documentation

## 2014-06-07 DIAGNOSIS — R079 Chest pain, unspecified: Secondary | ICD-10-CM | POA: Diagnosis present

## 2014-06-07 DIAGNOSIS — R10811 Right upper quadrant abdominal tenderness: Secondary | ICD-10-CM

## 2014-06-07 LAB — CBC WITH DIFFERENTIAL/PLATELET
BASOS ABS: 0 10*3/uL (ref 0.0–0.1)
Basophils Relative: 0 % (ref 0–1)
EOS PCT: 3 % (ref 0–5)
Eosinophils Absolute: 0.3 10*3/uL (ref 0.0–0.7)
HCT: 39.2 % (ref 36.0–46.0)
Hemoglobin: 12.8 g/dL (ref 12.0–15.0)
Lymphocytes Relative: 28 % (ref 12–46)
Lymphs Abs: 3.6 10*3/uL (ref 0.7–4.0)
MCH: 27.4 pg (ref 26.0–34.0)
MCHC: 32.7 g/dL (ref 30.0–36.0)
MCV: 83.8 fL (ref 78.0–100.0)
Monocytes Absolute: 0.7 10*3/uL (ref 0.1–1.0)
Monocytes Relative: 6 % (ref 3–12)
NEUTROS ABS: 8 10*3/uL — AB (ref 1.7–7.7)
Neutrophils Relative %: 63 % (ref 43–77)
PLATELETS: 333 10*3/uL (ref 150–400)
RBC: 4.68 MIL/uL (ref 3.87–5.11)
RDW: 12.6 % (ref 11.5–15.5)
WBC: 12.6 10*3/uL — AB (ref 4.0–10.5)

## 2014-06-07 LAB — URINE MICROSCOPIC-ADD ON

## 2014-06-07 LAB — URINALYSIS, ROUTINE W REFLEX MICROSCOPIC
Bilirubin Urine: NEGATIVE
Ketones, ur: 15 mg/dL — AB
Leukocytes, UA: NEGATIVE
Nitrite: NEGATIVE
PROTEIN: NEGATIVE mg/dL
Urobilinogen, UA: 0.2 mg/dL (ref 0.0–1.0)
pH: 6 (ref 5.0–8.0)

## 2014-06-07 LAB — I-STAT TROPONIN, ED
TROPONIN I, POC: 0 ng/mL (ref 0.00–0.08)
TROPONIN I, POC: 0 ng/mL (ref 0.00–0.08)

## 2014-06-07 LAB — COMPREHENSIVE METABOLIC PANEL
ALT: 11 U/L — ABNORMAL LOW (ref 14–54)
ANION GAP: 11 (ref 5–15)
AST: 15 U/L (ref 15–41)
Albumin: 3.8 g/dL (ref 3.5–5.0)
Alkaline Phosphatase: 50 U/L (ref 38–126)
BUN: 11 mg/dL (ref 6–20)
CHLORIDE: 102 mmol/L (ref 101–111)
CO2: 23 mmol/L (ref 22–32)
CREATININE: 0.75 mg/dL (ref 0.44–1.00)
Calcium: 9.1 mg/dL (ref 8.9–10.3)
Glucose, Bld: 294 mg/dL — ABNORMAL HIGH (ref 65–99)
POTASSIUM: 4.2 mmol/L (ref 3.5–5.1)
Sodium: 136 mmol/L (ref 135–145)
Total Bilirubin: 0.6 mg/dL (ref 0.3–1.2)
Total Protein: 6.9 g/dL (ref 6.5–8.1)

## 2014-06-07 LAB — LIPASE, BLOOD: Lipase: 50 U/L (ref 22–51)

## 2014-06-07 LAB — POC URINE PREG, ED: Preg Test, Ur: NEGATIVE

## 2014-06-07 MED ORDER — OXYCODONE-ACETAMINOPHEN 5-325 MG PO TABS
1.0000 | ORAL_TABLET | Freq: Once | ORAL | Status: AC
  Administered 2014-06-07: 1 via ORAL
  Filled 2014-06-07: qty 1

## 2014-06-07 MED ORDER — SODIUM CHLORIDE 0.9 % IV BOLUS (SEPSIS)
1000.0000 mL | Freq: Once | INTRAVENOUS | Status: AC
  Administered 2014-06-07: 1000 mL via INTRAVENOUS

## 2014-06-07 MED ORDER — NAPROXEN 500 MG PO TABS: 500.0000 mg | ORAL_TABLET | Freq: Two times a day (BID) | ORAL | Status: DC | PRN

## 2014-06-07 MED ORDER — MORPHINE SULFATE 4 MG/ML IJ SOLN
4.0000 mg | Freq: Once | INTRAMUSCULAR | Status: AC
  Administered 2014-06-07: 4 mg via INTRAVENOUS
  Filled 2014-06-07: qty 1

## 2014-06-07 MED ORDER — OXYCODONE-ACETAMINOPHEN 5-325 MG PO TABS: 1.0000 | ORAL_TABLET | Freq: Four times a day (QID) | ORAL | Status: DC | PRN

## 2014-06-07 NOTE — Discharge Instructions (Signed)
Use naprosyn and percocet as directed as needed for pain, don't drive while taking percocet. Use heat pad to affected areas of pain, 20 minutes at a time every hour. Avoid heavy lifting over 10 pounds. Stay well hydrated, take your diabetes medications as directed, and stop smoking. Follow up with your regular doctor in 1 week for recheck of your pain, and follow up with the gastroenterologist for ongoing evaluation of your right sided abdominal pain. Return to the ER for changes or worsening symptoms.   Chest Pain (Nonspecific) It is often hard to give a specific diagnosis for the cause of chest pain. There is always a chance that your pain could be related to something serious, such as a heart attack or a blood clot in the lungs. You need to follow up with your health care provider for further evaluation. CAUSES   Heartburn.  Pneumonia or bronchitis.  Anxiety or stress.  Inflammation around your heart (pericarditis) or lung (pleuritis or pleurisy).  A blood clot in the lung.  A collapsed lung (pneumothorax). It can develop suddenly on its own (spontaneous pneumothorax) or from trauma to the chest.  Shingles infection (herpes zoster virus). The chest wall is composed of bones, muscles, and cartilage. Any of these can be the source of the pain.  The bones can be bruised by injury.  The muscles or cartilage can be strained by coughing or overwork.  The cartilage can be affected by inflammation and become sore (costochondritis). DIAGNOSIS  Lab tests or other studies may be needed to find the cause of your pain. Your health care provider may have you take a test called an ambulatory electrocardiogram (ECG). An ECG records your heartbeat patterns over a 24-hour period. You may also have other tests, such as:  Transthoracic echocardiogram (TTE). During echocardiography, sound waves are used to evaluate how blood flows through your heart.  Transesophageal echocardiogram (TEE).  Cardiac  monitoring. This allows your health care provider to monitor your heart rate and rhythm in real time.  Holter monitor. This is a portable device that records your heartbeat and can help diagnose heart arrhythmias. It allows your health care provider to track your heart activity for several days, if needed.  Stress tests by exercise or by giving medicine that makes the heart beat faster. TREATMENT   Treatment depends on what may be causing your chest pain. Treatment may include:  Acid blockers for heartburn.  Anti-inflammatory medicine.  Pain medicine for inflammatory conditions.  Antibiotics if an infection is present.  You may be advised to change lifestyle habits. This includes stopping smoking and avoiding alcohol, caffeine, and chocolate.  You may be advised to keep your head raised (elevated) when sleeping. This reduces the chance of acid going backward from your stomach into your esophagus. Most of the time, nonspecific chest pain will improve within 2-3 days with rest and mild pain medicine.  HOME CARE INSTRUCTIONS   If antibiotics were prescribed, take them as directed. Finish them even if you start to feel better.  For the next few days, avoid physical activities that bring on chest pain. Continue physical activities as directed.  Do not use any tobacco products, including cigarettes, chewing tobacco, or electronic cigarettes.  Avoid drinking alcohol.  Only take medicine as directed by your health care provider.  Follow your health care provider's suggestions for further testing if your chest pain does not go away.  Keep any follow-up appointments you made. If you do not go to an appointment, you  could develop lasting (chronic) problems with pain. If there is any problem keeping an appointment, call to reschedule. SEEK MEDICAL CARE IF:   Your chest pain does not go away, even after treatment.  You have a rash with blisters on your chest.  You have a fever. SEEK  IMMEDIATE MEDICAL CARE IF:   You have increased chest pain or pain that spreads to your arm, neck, jaw, back, or abdomen.  You have shortness of breath.  You have an increasing cough, or you cough up blood.  You have severe back or abdominal pain.  You feel nauseous or vomit.  You have severe weakness.  You faint.  You have chills. This is an emergency. Do not wait to see if the pain will go away. Get medical help at once. Call your local emergency services (911 in [place] Do not drive yourself to the hospital. MAKE SURE YOU:   Understand these instructions.  Will watch your condition.  Will get help right away if you are not doing well or get worse. Document Released: 10/03/2004 Document Revised: 12/29/2012 Document Reviewed: 07/30/2007 [facility] Patient Information 2015 [place], [place]. This information is not intended to replace advice given to you by your health care provider. Make sure you discuss any questions you have with your health care provider.  Chest Wall Pain Chest wall pain is pain felt in or around the chest bones and muscles. It may take up to 6 weeks to get better. It may take longer if you are active. Chest wall pain can happen on its own. Other times, things like germs, injury, coughing, or exercise can cause the pain. HOME CARE   Avoid activities that make you tired or cause pain. Try not to use your chest, belly (abdominal), or side muscles. Do not use heavy weights.  Put ice on the sore area.  Put ice in a plastic bag.  Place a towel between your skin and the bag.  Leave the ice on for 15-20 minutes for the first 2 days.  Only take medicine as told by your doctor. GET HELP RIGHT AWAY IF:   You have more pain or are very uncomfortable.  You have a fever.  Your chest pain gets worse.  You have new problems.  You feel sick to your stomach (nauseous) or throw up (vomit).  You start to sweat or feel lightheaded.  You have a cough with mucus  (phlegm).  You cough up blood. MAKE SURE YOU:   Understand these instructions.  Will watch your condition.  Will get help right away if you are not doing well or get worse. Document Released: 06/12/2007 Document Revised: 03/18/2011 Document Reviewed: 08/20/2010 [facility] Patient Information 2015 [place], [place]. This information is not intended to replace advice given to you by your health care provider. Make sure you discuss any questions you have with your health care provider.  Chronic Back Pain  When back pain lasts longer than 3 months, it is called chronic back pain.People with chronic back pain often go through certain periods that are more intense (flare-ups).  CAUSES Chronic back pain can be caused by wear and tear (degeneration) on different structures in your back. These structures include:  The bones of your spine (vertebrae) and the joints surrounding your spinal cord and nerve roots (facets).  The strong, fibrous tissues that connect your vertebrae (ligaments). Degeneration of these structures may result in pressure on your nerves. This can lead to constant pain. HOME CARE INSTRUCTIONS  Avoid bending, heavy  lifting, prolonged sitting, and activities which make the problem worse.  Take brief periods of rest throughout the day to reduce your pain. Lying down or standing usually is better than sitting while you are resting.  Take over-the-counter or prescription medicines only as directed by your caregiver. SEEK IMMEDIATE MEDICAL CARE IF:   You have weakness or numbness in one of your legs or feet.  You have trouble controlling your bladder or bowels.  You have nausea, vomiting, abdominal pain, shortness of breath, or fainting. Document Released: 02/01/2004 Document Revised: 03/18/2011 Document Reviewed: 12/08/2010 [facility] Patient Information 2015 [place], [place]. This information is not intended to replace advice given to you by your health care provider. Make sure  you discuss any questions you have with your health care provider.  How to Avoid Diabetes Problems You can do a lot to prevent or slow down diabetes problems. Following your diabetes plan and taking care of yourself can reduce your risk of serious or life-threatening complications. Below, you will find certain things you can do to prevent diabetes problems. MANAGE YOUR DIABETES Follow your health care provider's, nurse educator's, and dietitian's instructions for managing your diabetes. They will teach you the basics of diabetes care. They can help answer questions you may have. Learn about diabetes and make healthy choices regarding eating and physical activity. Monitor your blood glucose level regularly. Your health care provider will help you decide how often to check your blood glucose level depending on your treatment goals and how well you are meeting them.  DO NOT USE NICOTINE Nicotine and diabetes are a dangerous combination. Nicotine raises your risk for diabetes problems. If you quit using nicotine, you will lower your risk for heart attack, stroke, nerve disease, and kidney disease. Your cholesterol and your blood pressure levels may improve. Your blood circulation will also improve. Do not use any tobacco products, including cigarettes, chewing tobacco, or electronic cigarettes. If you need help quitting, ask your health care provider. KEEP YOUR BLOOD PRESSURE UNDER CONTROL Keeping your blood pressure under control will help prevent damage to your eyes, kidneys, heart, and blood vessels. Blood pressure consists of two numbers. The top number should be below 120, and the bottom number should be below 80 (120/80). Keep your blood pressure as close to these numbers as you can. If you already have kidney disease, you may want even lower blood pressure to protect your kidneys. Talk to your health care provider to make sure that your blood pressure goal is right for your needs. Meal planning,  medicines, and exercise can help you reach your blood pressure target. Have your blood pressure checked at every visit with your health care provider. KEEP YOUR CHOLESTEROL UNDER CONTROL Normal cholesterol levels will help prevent heart disease and stroke. These are the biggest health problems for people with diabetes. Keeping cholesterol levels under control can also help with blood flow. Have your cholesterol level checked at least once a year. Your health care provider may prescribe a medicine known as a statin. Statins lower your cholesterol. If you are not taking a statin, ask your health care provider if you should be. Meal planning, exercise, and medicines can help you reach your cholesterol targets.  SCHEDULE AND KEEP YOUR ANNUAL PHYSICAL EXAMS AND EYE EXAMS Your health care provider will tell you how often he or she wants to see you depending on your plan of treatment. It is important that you keep these appointments so that possible problems can be identified early and complications  can be avoided or treated.  Every visit with your health care provider should include your weight, blood pressure, and an evaluation of your blood glucose control.  Your hemoglobin A1c should be checked:  At least twice a year if you are at your goal.  Every 3 months if there are changes in treatment.  If you are not meeting your goals.  Your blood lipids should be checked yearly. You should also be checked yearly to see if you have protein in your urine (microalbumin).  Schedule a dilated eye exam within 5 years of your diagnosis if you have type 1 diabetes, and then yearly. Schedule a dilated eye exam at diagnosis if you have type 2 diabetes, and then yearly. All exams thereafter can be extended to every 2 to 3 years if one or more exams have been normal. KEEP YOUR VACCINES CURRENT The flu vaccine is recommended yearly. The formula for the vaccine changes every year and needs to be updated for the best  protection against current viruses. It is recommended that people with diabetes who are over 108 years old get the pneumonia vaccine. In some cases, two separate shots may be given. Ask your health care provider if your pneumonia vaccination is up-to-date. However, there are some instances where another vaccine is recommended. Check with your health care provider. TAKE CARE OF YOUR FEET  Diabetes may cause you to have a poor blood supply (circulation) to your legs and feet. Because of this, the skin may be thinner, break easier, and heal more slowly. You also may have nerve damage in your legs and feet, causing decreased feeling. You may not notice minor injuries to your feet that could lead to serious problems or infections. Taking care of your feet is very important. Visual foot exams are performed at every routine medical visit. The exams check for cuts, injuries, or other problems with the feet. A comprehensive foot exam should be done yearly. This includes visual inspection as well as assessing foot pulses and testing for loss of sensation. You should also do the following:  Inspect your feet daily for cuts, calluses, blisters, ingrown toenails, and signs of infection, such as redness, swelling, or pus.  Wash and dry your feet thoroughly, especially between the toes.  Avoid soaking your feet regularly in hot water baths.  Moisturize dry skin with lotion, avoiding areas between your toes.  Cut toenails straight across and file the edges.  Avoid shoes that do not fit well or have areas that irritate your skin.  Avoid going barefooted or wearing only socks. Your feet need protection. TAKE CARE OF YOUR TEETH People with poorly controlled diabetes are more likely to have gum (periodontal) disease. These infections make diabetes harder to control. Periodontal diseases, if left untreated, can lead to tooth loss. Brush your teeth twice a day, floss, and see your dentist for checkups and cleaning every  6 months, or 2 times a year. ASK YOUR HEALTH CARE PROVIDER ABOUT TAKING ASPIRIN Taking aspirin daily is recommended to help prevent cardiovascular disease in people with and without diabetes. Ask your health care provider if this would benefit you and what dose he or she would recommend. DRINK RESPONSIBLY Moderate amounts of alcohol (less than 1 drink per day for adult women and less than 2 drinks per day for adult men) have a minimal effect on blood glucose if ingested with food. It is important to eat food with alcohol to avoid hypoglycemia. People should avoid alcohol if they have  a history of alcohol abuse or dependence, if they are pregnant, and if they have liver disease, pancreatitis, advanced neuropathy, or severe hypertriglyceridemia. LESSEN STRESS Living with diabetes can be stressful. When you are under stress, your blood glucose may be affected in two ways:  Stress hormones may cause your blood glucose to rise.  You may be distracted from taking good care of yourself. It is a good idea to be aware of your stress level and make changes that are necessary to help you better manage challenging situations. Support groups, planned relaxation, a hobby you enjoy, meditation, healthy relationships, and exercise all work to lower your stress level. If your efforts do not seem to be helping, get help from your health care provider or a trained mental health professional. Document Released: 09/11/2010 Document Revised: 05/10/2013 Document Reviewed: 02/17/2013 [facility] Patient Information 2015 [place], [place]. This information is not intended to replace advice given to you by your health care provider. Make sure you discuss any questions you have with your health care provider.

## 2014-06-07 NOTE — ED Notes (Signed)
Pt reports diffuse chest pain radiating to lower back and both buttocks. Onset this morning. CP increases with deep breathing. Denies CP upon arrival to ED. Respirations unlabored. Pt is alert and oriented x4.

## 2014-06-07 NOTE — ED Notes (Signed)
Pt to ultrasound at this time.

## 2014-06-07 NOTE — ED Provider Notes (Signed)
CSN: [id number]     Arrival date & time 06/07/14  1242 History  This chart was scribed for non-physician practitioner, Zacarias Pontes, PA-C, working with Orlie Dakin, MD, by Stephania Fragmin, ED Scribe. This patient was seen in room TR03C/TR03C and the patient's care was started at 3:52 PM.    Chief Complaint  Patient presents with  . Chest Pain  . Back Pain   Patient is a 34 y.o. female presenting with chest pain. The history is provided by the patient and a friend. No language interpreter was used.  Chest Pain Pain location:  R chest Pain quality: sharp   Pain radiates to:  Does not radiate Pain radiates to the back: no   Pain severity:  Severe Onset quality:  Sudden Duration:  11 hours Timing:  Intermittent Progression:  Unchanged Chronicity:  New Context: at rest   Relieved by:  Nothing Worsened by:  Deep breathing Ineffective treatments: narcotics, ibuprofen. Associated symptoms: back pain (chronic, worsened today) and cough (dry and intermittent)   Associated symptoms: no abdominal pain, no claudication, no diaphoresis, no dizziness, no fever, no lower extremity edema, no nausea, no near-syncope, no numbness, no orthopnea, no PND, no shortness of breath, no syncope, not vomiting and no weakness   Risk factors: diabetes mellitus, obesity and smoking   Risk factors: no birth control, no immobilization, not female and no surgery      HPI Comments: Catherine Cooley is a 34 y.o. female with a PMHx of DM, asthma, hypertension, and migraines, who presents to the Emergency Department complaining of intermittent, non-radiating, sharp, 8/10 right sided rib-margin pain that only occurs with inspiration, that began 10 hours ago when she woke up this morning, while at rest. She endorses a dry cough that has been ongoing recently. Patient has taken oxycodone and 2 ibuprofen for this with no relief. Patient has had the same pain before, but it is less severe this time. Patient also complains  of lower back pain radiating to her buttocks. She states she has pain there all the time but it acutely worsened to a severe level at work this morning and feels "deeper in my butt" than usual. She denies a personal history of any IVDA or CA. She denies any recent heavy lifting. She denies any perianal numbness or weakness, feelings of near-syncope, lightheadedness/dizziness, diaphoresis, SOB, orthopnea, PND, claudication, fever, hemoptysis, leg swelling, recent travel or immobilization, estrogen therapies, abd pain, nausea, vomiting, diarrhea, constipation, melena, hematochezia, dysuria, hematuria, vaginal bleeding, or vaginal discharge. Patient is a current smoker. No hx of DVT/PE. No cardiac history.  Past Medical History  Diagnosis Date  . Diabetes mellitus   . Asthma   . Hypertension   . Migraine    Past Surgical History  Procedure Laterality Date  . No past surgeries     Family History  Problem Relation Age of Onset  . Hypertension Mother   . Diabetes Father    History  Substance Use Topics  . Smoking status: Current Every Day Smoker -- 0.50 packs/day  . Smokeless tobacco: Not on file  . Alcohol Use: Yes   OB History    Gravida Para Term Preterm AB TAB SAB Ectopic Multiple Living   0              Review of Systems  Constitutional: Negative for fever, chills and diaphoresis.  Respiratory: Positive for cough (dry and intermittent). Negative for shortness of breath.        No hemoptysis  Cardiovascular: Positive  for chest pain (right-sided rib margin). Negative for orthopnea, claudication, leg swelling, syncope, PND and near-syncope.  Gastrointestinal: Negative for nausea, vomiting, abdominal pain, diarrhea, constipation and blood in stool.  Genitourinary: Negative for dysuria, hematuria, flank pain, vaginal bleeding and vaginal discharge.  Musculoskeletal: Positive for back pain (chronic, worsened today). Negative for gait problem.  Skin: Negative for color change.   Allergic/Immunologic: Positive for immunocompromised state (diabetic).  Neurological: Negative for dizziness, syncope, weakness, light-headedness and numbness.  Psychiatric/Behavioral: Negative for confusion.  10 Systems reviewed and all are negative for acute change except as noted in the HPI.   Allergies  Vicodin  Home Medications   Prior to Admission medications   Medication Sig Start Date End Date Taking? Authorizing Provider  aspirin-acetaminophen-caffeine (EXCEDRIN MIGRAINE) [phone] MG per tablet Take 2 tablets by mouth every 6 (six) hours as needed for headache.    Historical Provider, MD  fluconazole (DIFLUCAN) 150 MG tablet Take 150 mg by mouth daily as needed (yeast infection).    Historical Provider, MD  Insulin Glargine (LANTUS) 100 UNIT/ML Solostar Pen Inject 30 Units into the skin every evening.    Historical Provider, MD  insulin lispro (HUMALOG) 100 UNIT/ML KiwkPen Inject 10 Units into the skin daily as needed (high blood sugar).    Historical Provider, MD  metformin (FORTAMET) 1000 MG (OSM) 24 hr tablet Take 1,000 mg by mouth 4 (four) times daily.    Historical Provider, MD  oxyCODONE-acetaminophen (PERCOCET) 5-325 MG per tablet Take 1 tablet by mouth every 6 (six) hours as needed for moderate pain. 01/30/13   Evelina Bucy, MD  pantoprazole (PROTONIX) 20 MG tablet Take 1 tablet (20 mg total) by mouth daily. Patient not taking: Reported on 04/23/2014 01/30/13   Evelina Bucy, MD   BP 111/80 mmHg  Pulse 77  Temp(Src) 98.4 F (36.9 C) (Oral)  Resp 18  SpO2 100%  LMP 06/07/2014 Physical Exam  Constitutional: She is oriented to person, place, and time. Vital signs are normal. She appears well-developed and well-nourished.  Non-toxic appearance. No distress.  Afebrile, nontoxic, NAD  HENT:  Head: Normocephalic and atraumatic.  Mouth/Throat: Oropharynx is clear and moist and mucous membranes are normal.  Eyes: Conjunctivae and EOM are normal. Right eye exhibits no  discharge. Left eye exhibits no discharge.  Neck: Normal range of motion. Neck supple.  Cardiovascular: Normal rate, regular rhythm, normal heart sounds and intact distal pulses.  Exam reveals no gallop and no friction rub.   No murmur heard. RRR, nl s1/s2, no m/r/g, distal pulses intact, no pedal edema   Pulmonary/Chest: Effort normal and breath sounds normal. No respiratory distress. She has no decreased breath sounds. She has no wheezes. She has no rhonchi. She has no rales. She exhibits tenderness. She exhibits no crepitus, no deformity and no retraction.    CTAB in all lung fields, no w/r/r, no hypoxia or increased WOB, speaking in full sentences, SpO2 100% on RA Mild R costal margin TTP, no crepitus or deformity, no retractions.  Abdominal: Soft. Normal appearance and bowel sounds are normal. She exhibits no distension. There is tenderness in the right upper quadrant. There is positive Murphy's sign. There is no rigidity, no rebound, no guarding, no CVA tenderness and no tenderness at McBurney's point.    Soft, nondistended, +BS throughout, with RUQ TTP, no r/g/r, +murphy's, neg mcburney's, no CVA TTP   Musculoskeletal: Normal range of motion.       Lumbar back: She exhibits tenderness. She exhibits normal range of motion,  no deformity and no spasm.       Back:  Sacral/coccyx region and lumbar spine with FROM intact without spinous process TTP, no bony stepoffs or deformities, with mild R sided paraspinous muscle TTP near coccyx and gluteus, no palpable muscle spasms. Strength 5/5 in all extremities, sensation grossly intact in all extremities, negative SLR bilaterally, gait steady and nonantalgic. No overlying skin changes.   Neurological: She is alert and oriented to person, place, and time. She has normal strength. No sensory deficit.  Skin: Skin is warm, dry and intact. No rash noted.  Psychiatric: She has a normal mood and affect. Her behavior is normal.  Nursing note and vitals  reviewed.   ED Course  Procedures (including critical care time)  DIAGNOSTIC STUDIES: Oxygen Saturation is 100% on RA, normal by my interpretation.    COORDINATION OF CARE: 4:06 PM - Discussed treatment plan with pt at bedside which includes pain medication and abdominal U/S, and pt agreed to plan.   Labs Review Labs Reviewed  CBC WITH DIFFERENTIAL/PLATELET - Abnormal; Notable for the following:    WBC 12.6 (*)    Neutro Abs 8.0 (*)    All other components within normal limits  COMPREHENSIVE METABOLIC PANEL - Abnormal; Notable for the following:    Glucose, Bld 294 (*)    ALT 11 (*)    All other components within normal limits  URINALYSIS, ROUTINE W REFLEX MICROSCOPIC (NOT AT [facility]) - Abnormal; Notable for the following:    Specific Gravity, Urine >1.030 (*)    Glucose, UA >1000 (*)    Hgb urine dipstick LARGE (*)    Ketones, ur 15 (*)    All other components within normal limits  LIPASE, BLOOD  URINE MICROSCOPIC-ADD ON  I-STAT TROPOININ, ED  POC URINE PREG, ED  I-STAT TROPOININ, ED    Imaging Review Dg Chest 2 View  06/07/2014   CLINICAL DATA:  Diffuse chest pain radiating into the low back and both buttocks today. Increased pain with inspiration. Initial encounter.  EXAM: CHEST  2 VIEW  COMPARISON:  None.  FINDINGS: The heart size and mediastinal contours are normal. The lungs are clear. There is no pleural effusion or pneumothorax. No acute osseous findings are identified.  IMPRESSION: No active cardiopulmonary process.   Electronically Signed   By: Richardean Sale M.D.   On: 06/07/2014 14:23   US Abdomen Complete  06/07/2014   CLINICAL DATA:  34 year old female with acute right-sided abdominal pain.  EXAM: ULTRASOUND ABDOMEN COMPLETE  COMPARISON:  None.  FINDINGS: Gallbladder: The gallbladder is unremarkable. There is no evidence of cholelithiasis or acute cholecystitis.  Common bile duct: Diameter: 2.5 mm. There is no evidence of intrahepatic or extrahepatic biliary  dilatation. The visualized CBD is unremarkable.  Liver: No focal lesion identified. Within normal limits in parenchymal echogenicity.  IVC: No abnormality visualized.  Pancreas: Visualized portion unremarkable.  Spleen: Size and appearance within normal limits.  Right Kidney: Length: 10.9 cm. Echogenicity within normal limits. No mass or hydronephrosis visualized.  Left Kidney: Length: 10.9 cm. Echogenicity within normal limits. No mass or hydronephrosis visualized.  Abdominal aorta: No aneurysm visualized.  Other findings: None.  IMPRESSION: Normal abdominal ultrasound.   Electronically Signed   By: Margarette Canada M.D.   On: 06/07/2014 17:24     EKG Interpretation None     ED ECG REPORT   Date: 06/07/2014  Rate: 78  Rhythm: normal sinus rhythm  QRS Axis: normal  Intervals: normal  ST/T Wave  abnormalities: normal  Conduction Disutrbances:none  Narrative Interpretation:   Old EKG Reviewed: none available  I have personally reviewed the EKG tracing and agree with the computerized printout as noted.   MDM   Final diagnoses:  RUQ abdominal tenderness  Atypical chest pain  Chronic low back pain  Type 2 diabetes mellitus with hyperglycemia  Tobacco use disorder    34 y.o. female here with RUQ abdominal pain which she thought was chest pain but she points to her RUQ. On exam, +murphy's. Also here with lower back pain at her coccyx, has been evaluated for same before. No red flag s/sx for back pain, ambulatory without difficulty, neurovascularly intact in all extremities. No SOB, no hypoxia or tachycardia, no RFs for DVT/PE, doubt this as a cause. Trop neg, EKG unremarkable, CXR neg. CBC showing chronically elevated WBC similar to prior, CMP showing hyperglycemia without changes in bicarb or anion gap. Will obtain lipase and U/A and upreg, and get abd u/s to eval RUQ for gallbladder pathology. Will give pain meds and reassess shortly.   6:03 PM U/A showing mild ketones but since no evidence of  DKA otherwise, doubt this is a cause. Does also show RBCs but pt is on her menses therefore likely from that source. Upreg neg. Lipase WNL. U/S without evidence of gallbladder pathology, but discussed that she could still have biliary colic from sludge and might benefit from a HIDA scan outpt. Pain could be musculoskeletal since she has reproducibility. PERC negative and low clinical suspicion, therefore doubt need for dimer or CTA. Will get second troponin to confirm no elevations have occurred. Pain still ongoing although somewhat improved, will give percocet since she states this helps. Will reassess after second troponin and percocet.  7:21 PM Second trop neg. Pain improving. Will have her f/up with PCP in 1wk for her pain, and GI doctor in 1wk for ongoing eval of possible gallbladder etiology. Discussed heat and pain med use. I explained the diagnosis and have given explicit precautions to return to the ER including for any other new or worsening symptoms. The patient understands and accepts the medical plan as it's been dictated and I have answered their questions. Discharge instructions concerning home care and prescriptions have been given. The patient is STABLE and is discharged to home in good condition.   I personally performed the services described in this documentation, which was scribed in my presence. The recorded information has been reviewed and is accurate.  BP 108/70 mmHg  Pulse 61  Temp(Src) 98.5 F (36.9 C) (Oral)  Resp 16  SpO2 100%  LMP 06/07/2014  Meds ordered this encounter  Medications  . sodium chloride 0.9 % bolus 1,000 mL    Sig:   . morphine 4 MG/ML injection 4 mg    Sig:   . oxyCODONE-acetaminophen (PERCOCET/ROXICET) 5-325 MG per tablet 1 tablet    Sig:   . oxyCODONE-acetaminophen (PERCOCET) 5-325 MG per tablet    Sig: Take 1 tablet by mouth every 6 (six) hours as needed for severe pain.    Dispense:  6 tablet    Refill:  0    Order Specific Question:   Supervising Provider    Answer:  MILLER, BRIAN [[id number]]  . naproxen (NAPROSYN) 500 MG tablet    Sig: Take 1 tablet (500 mg total) by mouth 2 (two) times daily as needed for mild pain, moderate pain or headache (TAKE WITH MEALS.).    Dispense:  20 tablet    Refill:  0  Order Specific Question:  Supervising Provider    Answer:  Noemi Chapel [address] Camprubi-Soms, PA-C 06/07/14 Isabelle Course, MD 06/08/14 [id number]

## 2014-06-07 NOTE — ED Notes (Signed)
Pt returned from ultrasound

## 2014-06-08 LAB — CBG MONITORING, ED: GLUCOSE-CAPILLARY: 287 mg/dL — AB (ref 65–99)

## 2014-08-29 ENCOUNTER — Emergency Department (HOSPITAL_COMMUNITY): Payer: Managed Care, Other (non HMO)

## 2014-08-29 ENCOUNTER — Emergency Department (HOSPITAL_COMMUNITY)
Admission: EM | Admit: 2014-08-29 | Discharge: 2014-08-29 | Disposition: A | Discharge status: Home / Self Care | Payer: Managed Care, Other (non HMO) | Attending: Emergency Medicine | Admitting: Emergency Medicine

## 2014-08-29 ENCOUNTER — Encounter (HOSPITAL_COMMUNITY): Payer: Self-pay | Admitting: *Deleted

## 2014-08-29 DIAGNOSIS — W1839XA Other fall on same level, initial encounter: Secondary | ICD-10-CM | POA: Diagnosis not present

## 2014-08-29 DIAGNOSIS — Y998 Other external cause status: Secondary | ICD-10-CM | POA: Insufficient documentation

## 2014-08-29 DIAGNOSIS — M549 Dorsalgia, unspecified: Secondary | ICD-10-CM

## 2014-08-29 DIAGNOSIS — S29001A Unspecified injury of muscle and tendon of front wall of thorax, initial encounter: Secondary | ICD-10-CM | POA: Diagnosis not present

## 2014-08-29 DIAGNOSIS — J45909 Unspecified asthma, uncomplicated: Secondary | ICD-10-CM | POA: Diagnosis not present

## 2014-08-29 DIAGNOSIS — G43909 Migraine, unspecified, not intractable, without status migrainosus: Secondary | ICD-10-CM | POA: Insufficient documentation

## 2014-08-29 DIAGNOSIS — G8929 Other chronic pain: Secondary | ICD-10-CM

## 2014-08-29 DIAGNOSIS — Y9289 Other specified places as the place of occurrence of the external cause: Secondary | ICD-10-CM | POA: Insufficient documentation

## 2014-08-29 DIAGNOSIS — S3992XA Unspecified injury of lower back, initial encounter: Secondary | ICD-10-CM | POA: Diagnosis not present

## 2014-08-29 DIAGNOSIS — Y9389 Activity, other specified: Secondary | ICD-10-CM | POA: Diagnosis not present

## 2014-08-29 DIAGNOSIS — Z720 Tobacco use: Secondary | ICD-10-CM | POA: Diagnosis not present

## 2014-08-29 DIAGNOSIS — S8991XA Unspecified injury of right lower leg, initial encounter: Secondary | ICD-10-CM | POA: Diagnosis present

## 2014-08-29 DIAGNOSIS — Z79899 Other long term (current) drug therapy: Secondary | ICD-10-CM | POA: Insufficient documentation

## 2014-08-29 DIAGNOSIS — Z794 Long term (current) use of insulin: Secondary | ICD-10-CM | POA: Insufficient documentation

## 2014-08-29 DIAGNOSIS — I10 Essential (primary) hypertension: Secondary | ICD-10-CM | POA: Insufficient documentation

## 2014-08-29 DIAGNOSIS — R739 Hyperglycemia, unspecified: Secondary | ICD-10-CM

## 2014-08-29 DIAGNOSIS — E1165 Type 2 diabetes mellitus with hyperglycemia: Secondary | ICD-10-CM | POA: Diagnosis not present

## 2014-08-29 DIAGNOSIS — Z7982 Long term (current) use of aspirin: Secondary | ICD-10-CM | POA: Insufficient documentation

## 2014-08-29 LAB — BASIC METABOLIC PANEL
ANION GAP: 8 (ref 5–15)
BUN: 7 mg/dL (ref 6–20)
CALCIUM: 9.1 mg/dL (ref 8.9–10.3)
CO2: 25 mmol/L (ref 22–32)
CREATININE: 0.62 mg/dL (ref 0.44–1.00)
Chloride: 103 mmol/L (ref 101–111)
Glucose, Bld: 285 mg/dL — ABNORMAL HIGH (ref 65–99)
Potassium: 4.5 mmol/L (ref 3.5–5.1)
SODIUM: 136 mmol/L (ref 135–145)

## 2014-08-29 LAB — CBC
HCT: 37.8 % (ref 36.0–46.0)
HEMOGLOBIN: 12.2 g/dL (ref 12.0–15.0)
MCH: 27.1 pg (ref 26.0–34.0)
MCHC: 32.3 g/dL (ref 30.0–36.0)
MCV: 83.8 fL (ref 78.0–100.0)
PLATELETS: 318 10*3/uL (ref 150–400)
RBC: 4.51 MIL/uL (ref 3.87–5.11)
RDW: 12.4 % (ref 11.5–15.5)
WBC: 9.6 10*3/uL (ref 4.0–10.5)

## 2014-08-29 LAB — CBG MONITORING, ED
GLUCOSE-CAPILLARY: 247 mg/dL — AB (ref 65–99)
GLUCOSE-CAPILLARY: 275 mg/dL — AB (ref 65–99)

## 2014-08-29 LAB — I-STAT TROPONIN, ED: TROPONIN I, POC: 0 ng/mL (ref 0.00–0.08)

## 2014-08-29 MED ORDER — KETOROLAC TROMETHAMINE 60 MG/2ML IM SOLN: 60.0000 mg | Freq: Once | INTRAMUSCULAR | Status: DC

## 2014-08-29 MED ORDER — NAPROXEN 500 MG PO TABS: 500.0000 mg | ORAL_TABLET | Freq: Two times a day (BID) | ORAL | Status: DC

## 2014-08-29 NOTE — ED Notes (Signed)
Patient with reported onset of chest pain and upper back pain since yesterday.  She states she thinks her sugar is elevated.  She does not have a meter to check.  She is on insulin.  She took 10 units of humolog this morning.  She took lantus 30 units last night.  Patient states today at work she fell and hurt her right knee.  Patient is alert but sleepy.

## 2014-08-29 NOTE — ED Notes (Signed)
Went into room to apply Knee immobilizer, patient had left.

## 2014-08-29 NOTE — Discharge Instructions (Signed)
Follow-up with your doctor orthopedics upon arrival back to [place]. Use knee immobilizer at all times for comfort. Take the Naprosyn as directed. Get your blood sugar testing machine replaced when you get back to [place]. X-rays of the right knee showed no significant abnormality. Work note provided.

## 2014-08-29 NOTE — ED Provider Notes (Signed)
CSN: [id number]     Arrival date & time 08/29/14  1154 History   First MD Initiated Contact with Patient 08/29/14 1826     Chief Complaint  Patient presents with  . Back Pain  . Chest Pain  . Hyperglycemia  . Knee Pain  . Fall     (Consider location/radiation/quality/duration/timing/severity/associated sxs/prior Treatment) Patient is a 34 y.o. female presenting with back pain, chest pain, hyperglycemia, knee pain, and fall. The history is provided by the patient.  Back Pain Associated symptoms: no abdominal pain, no chest pain, no dysuria, no fever and no headaches   Chest Pain Associated symptoms: back pain   Associated symptoms: no abdominal pain, no fever, no headache and no shortness of breath   Hyperglycemia Associated symptoms: no abdominal pain, no chest pain, no confusion, no dysuria, no fever and no shortness of breath   Knee Pain Associated symptoms: back pain   Associated symptoms: no fever   Fall Pertinent negatives include no chest pain, no abdominal pain, no headaches and no shortness of breath.   patient presenting from work following a fall and injury to her right knee with additional complaints of chest pain has chronic back pain also thinks blood sugar is elevated because she is unable to check her blood sugar because her machine is not working properly. Patient is still taking her insulin. 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 Pain is 8 out of 10. Denies any other injuries. [place] in [place]. She is followed by pain management for chronic back pain there. And her primary care doctor and diabetic follow-up as there as well.  Past Medical History  Diagnosis Date  . Diabetes mellitus   . Asthma   . Hypertension   . Migraine    Past Surgical History  Procedure  Laterality Date  . No past surgeries     Family History  Problem Relation Age of Onset  . Hypertension Mother   . Diabetes Father    Social History  Substance Use Topics  . Smoking status: Current Every Day Smoker -- 0.50 packs/day  . Smokeless tobacco: None  . Alcohol Use: Yes   OB History    Gravida Para Term Preterm AB TAB SAB Ectopic Multiple Living   0              Review of Systems  Constitutional: Negative for fever.  HENT: Negative for congestion.   Eyes: Negative for redness.  Respiratory: Negative for shortness of breath.   Cardiovascular: Negative for chest pain.  Gastrointestinal: Negative for abdominal pain.  Genitourinary: Negative for dysuria.  Musculoskeletal: Positive for back pain.  Skin: Negative for wound.  Neurological: Negative for headaches.  Hematological: Does not bruise/bleed easily.  Psychiatric/Behavioral: Negative for confusion.      Allergies  Vicodin  Home Medications   Prior to Admission medications   Medication Sig Start Date End Date Taking? Authorizing Provider  aspirin-acetaminophen-caffeine (EXCEDRIN MIGRAINE) [phone] MG per tablet Take 2 tablets by mouth every 6 (six) hours as needed for headache.    Historical Provider, MD  fluconazole (DIFLUCAN) 150 MG tablet Take 150 mg by mouth daily as needed (yeast infection).    Historical Provider, MD  Insulin Glargine (LANTUS) 100 UNIT/ML Solostar Pen Inject 30 Units into the skin every evening.  Historical Provider, MD  insulin lispro (HUMALOG) 100 UNIT/ML KiwkPen Inject 10 Units into the skin daily as needed (high blood sugar).    Historical Provider, MD  metformin (FORTAMET) 1000 MG (OSM) 24 hr tablet Take 1,000 mg by mouth 4 (four) times daily.    Historical Provider, MD  naproxen (NAPROSYN) 500 MG tablet Take 1 tablet (500 mg total) by mouth 2 (two) times daily as needed for mild pain, moderate pain or headache (TAKE WITH MEALS.). 06/07/14   Mercedes Camprubi-Soms, PA-C  naproxen  (NAPROSYN) 500 MG tablet Take 1 tablet (500 mg total) by mouth 2 (two) times daily. 08/29/14   Fredia Sorrow, MD  oxyCODONE-acetaminophen (PERCOCET) 5-325 MG per tablet Take 1 tablet by mouth every 6 (six) hours as needed for moderate pain. 01/30/13   Evelina Bucy, MD  oxyCODONE-acetaminophen (PERCOCET) 5-325 MG per tablet Take 1 tablet by mouth every 6 (six) hours as needed for severe pain. 06/07/14   Mercedes Camprubi-Soms, PA-C  pantoprazole (PROTONIX) 20 MG tablet Take 1 tablet (20 mg total) by mouth daily. Patient not taking: Reported on 04/23/2014 01/30/13   Evelina Bucy, MD   BP 122/79 mmHg  Pulse 67  Temp(Src) 98.1 F (36.7 C) (Oral)  Resp 16  Ht 5\' 10"  (1.778 m)  Wt 197 lb 4 oz (89.472 kg)  BMI 28.30 kg/m2  SpO2 100%  LMP 08/27/2014 Physical Exam  Constitutional: She is oriented to person, place, and time. She appears well-developed and well-nourished. No distress.  HENT:  Head: Normocephalic and atraumatic.  Mouth/Throat: Oropharynx is clear and moist.  Eyes: Conjunctivae and EOM are normal. Pupils are equal, round, and reactive to light.  Neck: Normal range of motion. Neck supple.  Cardiovascular: Normal rate, regular rhythm and normal heart sounds.   No murmur heard. Pulmonary/Chest: Effort normal and breath sounds normal. No respiratory distress.  Abdominal: Soft. Bowel sounds are normal. There is no tenderness.  Musculoskeletal: She exhibits tenderness.  Patient with tenderness along the right knee joint line patella is not dislocated there is no effusion. No erythema no contusion. Dorsalis pedis pulses 2+. Ankle nontender good range of motion.  Neurological: She is alert and oriented to person, place, and time. No cranial nerve deficit. She exhibits normal muscle tone. Coordination normal.  Skin: Skin is warm. No erythema.  Nursing note and vitals reviewed.   ED Course  Procedures (including critical care time) Labs Review Labs Reviewed  BASIC METABOLIC PANEL -  Abnormal; Notable for the following:    Glucose, Bld 285 (*)    All other components within normal limits  CBG MONITORING, ED - Abnormal; Notable for the following:    Glucose-Capillary 247 (*)    All other components within normal limits  CBG MONITORING, ED - Abnormal; Notable for the following:    Glucose-Capillary 275 (*)    All other components within normal limits  CBC  I-STAT TROPOININ, ED    Imaging Review Dg Chest 2 View  08/29/2014   CLINICAL DATA:  Chest pain starting this morning  EXAM: CHEST  2 VIEW  COMPARISON:  06/07/2011  FINDINGS: Cardiomediastinal silhouette is stable. No acute infiltrate or pleural effusion. No pulmonary edema. Bony thorax is unremarkable.  IMPRESSION: No active cardiopulmonary disease.   Electronically Signed   By: Lahoma Crocker M.D.   On: 08/29/2014 14:42   Dg Knee Complete 4 Views Right  08/29/2014   CLINICAL DATA:  [place] today at work, right knee pain  EXAM: RIGHT KNEE - COMPLETE 4+ VIEW  COMPARISON:  None.  FINDINGS: Four views of the right knee submitted. No acute fracture or subluxation. No radiopaque foreign body.  IMPRESSION: Negative.   Electronically Signed   By: Lahoma Crocker M.D.   On: 08/29/2014 19:27   I have personally reviewed and evaluated these images and lab results as part of my medical decision-making.   EKG Interpretation   Date/Time:  Monday August 29 2014 13:35:51 EDT Ventricular Rate:  72 PR Interval:  154 QRS Duration: 84 QT Interval:  398 QTC Calculation: 435 R Axis:   73 Text Interpretation:  Normal sinus rhythm Normal ECG Confirmed by  Desiderio Dolata  MD, Shameika Speelman ([id number]) on 08/29/2014 6:30:05 PM      MDM   Final diagnoses:  Knee injury, right, initial encounter  Chronic back pain  Hyperglycemia    Patient presented to emergency part mostly due to an injury that occurred at work. Her right foot got caught under a pallet on a truck and she fell twisting her knee. Complaint of knee pain along the joint line. No significant  effusion x-rays of the knee are negative. Decreased range of motion some joint line tenderness but no effusion patella is not dislocated.  Patient also has long-standing diabetes currently her blood sugar testing machine is broken she is from [place] [facility] and her primary care doctor is there when she returned she is going to get that replaced. Blood sugars here are acceptable.  Patient has a history of chronic back pain followed by pain management in [place]. Patient was requesting Percocet however told her that injury as a properly treated with non-steroidal anti-inflammatory medicine patient given a shot of Toradol here.  The knee injury treated with knee immobilizer Naprosyn and a work note to be off work to rest the week. Follow-up will be in the [place] area.  Also in triage patient complained of chest pain upper back pain since yesterday. Workup for that was negative troponin was negative RE dimension chest x-ray was negative EKG without any acute changes.  Fredia Sorrow, MD 08/29/14 1949

## 2014-08-29 NOTE — ED Notes (Signed)
Patient left prior to receiving knee immobilizer or discharge instructions. Ambulatory.

## 2014-09-20 ENCOUNTER — Ambulatory Visit: Payer: Managed Care, Other (non HMO) | Attending: Anesthesiology | Admitting: Physical Therapy

## 2014-09-20 ENCOUNTER — Encounter: Payer: Self-pay | Admitting: Physical Therapy

## 2014-09-20 DIAGNOSIS — M2569 Stiffness of other specified joint, not elsewhere classified: Secondary | ICD-10-CM

## 2014-09-20 DIAGNOSIS — M533 Sacrococcygeal disorders, not elsewhere classified: Secondary | ICD-10-CM | POA: Insufficient documentation

## 2014-09-20 DIAGNOSIS — M545 Low back pain: Secondary | ICD-10-CM | POA: Insufficient documentation

## 2014-09-20 DIAGNOSIS — R293 Abnormal posture: Secondary | ICD-10-CM | POA: Diagnosis present

## 2014-09-20 DIAGNOSIS — R29898 Other symptoms and signs involving the musculoskeletal system: Secondary | ICD-10-CM | POA: Diagnosis present

## 2014-09-20 DIAGNOSIS — R198 Other specified symptoms and signs involving the digestive system and abdomen: Secondary | ICD-10-CM | POA: Diagnosis present

## 2014-09-20 DIAGNOSIS — M2560 Stiffness of unspecified joint, not elsewhere classified: Secondary | ICD-10-CM

## 2014-09-20 NOTE — Therapy (Addendum)
[place], [place], [address] Phone: [phone]   Fax:  [phone]  Physical Therapy Evaluation/Discharge Note  Patient Details  Name: Catherine Cooley MRN: [id number] Date of Birth: 29-May-1980 Referring Provider:  Brandy Hale, MD  Encounter Date: 09/20/2014      PT End of Session - 09/20/14 1636    Visit Number 1   Number of Visits 12   Date for PT Re-Evaluation 11/01/14   Authorization Type Cigna/Cigna Managed   PT Start Time 0345   PT Stop Time 0445   PT Time Calculation (min) 60 min   Activity Tolerance Patient tolerated treatment well   Behavior During Therapy [facility] for tasks assessed/performed      Past Medical History  Diagnosis Date  . Diabetes mellitus   . Asthma   . Hypertension   . Migraine     Past Surgical History  Procedure Laterality Date  . No past surgeries      There were no vitals filed for this visit.  Visit Diagnosis:  Low back pain without sciatica, unspecified back pain laterality  Coccyx pain  Sacral pain  Abnormal posture  Decreased ROM of trunk and back  Abdominal weakness      Subjective Assessment - 09/20/14 1608    Subjective Pt has had back pain for years but has now been working with a pain doctor and will have an injection on 10-11-14 for sacral/coccyx pain as well has low back pain. 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 not even 5 min   How long can you stand comfortably? 2 hours   How long can you walk comfortably? 2 hours   Patient Stated Goals Pt wants to be able to feel better and driving better   Currently in Pain? Yes   Pain Score 6    Pain Location Back   Pain Orientation Mid;Lower;Right   Pain Descriptors / Indicators Tightness;Nagging;Aching   Pain Type Chronic pain   Pain Onset More than a month ago   Pain Frequency Intermittent   Aggravating Factors  bedning over and sitting for  long time   Pain Relieving Factors nothing   Multiple Pain Sites Yes   Pain Score 9   Pain Location Sacrum   Pain Orientation Mid   Pain Descriptors / Indicators Sharp   Pain Type Chronic pain   Pain Onset More than a month ago   Pain Frequency Constant   Aggravating Factors  sitting   Pain Relieving Factors nothing            [facility] PT Assessment - 09/20/14 1553    Assessment   Medical Diagnosis low back pain   Onset Date/Surgical Date 09/19/13  Pt states back has been hurting > 5 years ago.with pain mang   Hand Dominance Right   Next MD Visit 10-12-14   Prior Therapy none   Precautions   Precautions None   Restrictions   Weight Bearing Restrictions No   Balance Screen   Has the patient fallen in the past 6 months Yes   How many times? 1  Due to diabetes, tripped on pallat at work   Has the patient had a decrease in activity level because of a fear of falling?  No   Is the patient reluctant to leave their home because of a fear of falling?  No   Home Social worker Private residence   Living  Arrangements Spouse/significant other   Type of Home Mobile home   Home Access Stairs to enter   Prior Function   Level of Independence Independent;Independent with basic ADLs   Vocation Full time employment   Vocation Requirements lifting boxes    Cognition   Overall Cognitive Status Within Functional Limits for tasks assessed   Observation/Other Assessments   Focus on Therapeutic Outcomes (FOTO)  Intake 52% limitiation 48% predicted 35%   Posture/Postural Control   Posture/Postural Control Postural limitations   Postural Limitations Rounded Shoulders;Forward head   Posture Comments Right pelvic level higher than left and anteriorly rotatied on Right   AROM   Lumbar Flexion 50   Lumbar Extension 20   Lumbar - Right Side Bend 25   Lumbar - Left Side Bend 25   Lumbar - Right Rotation 80   Lumbar - Left Rotation 70   Strength   Overall Strength Within  functional limits for tasks performed   Overall Strength Comments Pt with weak core  abdominal 3/5   Flexibility   Hamstrings tight hamstrings. R >L   Quadratus Lumborum right pelvic level high  QL tight on R   Palpation   Palpation comment Pt with tenderness over R Quadratus lumborum and mid line lumbar spinal hypomobility and stiff ness dominant.  Pt with tenderness over R sacrum towards coccyx.                     [facility] PT Treatment/Exercise - 09/20/14 1553    Lumbar Exercises: Stretches   Quadruped Mid Back Stretch 2 reps;30 seconds  forward and right /left x 2   Lumbar Exercises: Sidelying   Other Sidelying Lumbar Exercises Pt in left sidelying with pillow under left torso for Quadratus lumborum strtech for 3 min    Moist Heat Therapy   Number Minutes Moist Heat 15 Minutes   Moist Heat Location Lumbar Spine   Electrical Stimulation   Electrical Stimulation Location low back   Electrical Stimulation Action IFC   Electrical Stimulation Parameters to pt tolerance for 15 min   Electrical Stimulation Goals Pain   Manual Therapy   Myofascial Release Right Quadratus lumborum in left sidelying                PT Education - 09/20/14 1708    Education provided Yes   Education Details POC. Pt given initial HEP for Quadratus Lumborum stretch and mid back stretch , sitting and standing posture   Person(s) Educated Patient   Methods Explanation;Demonstration;Handout;Verbal cues   Comprehension Verbalized understanding;Returned demonstration          PT Short Term Goals - 09/20/14 1716    PT SHORT TERM GOAL #1   Title "Independent with initial HEP   Time 3   PT SHORT TERM GOAL #2   Title "Report pain decrease from 6/10 to 4 /10.   Time 3   Period Weeks   Status New   PT SHORT TERM GOAL #3   Title "Demonstrate understanding of proper sitting posture, body mechanics, work ergonomics, and be more conscious of position and posture throughout the day.     Time 3   Period Weeks   Status New           PT Long Term Goals - 09/20/14 1717    PT LONG TERM GOAL #1   Title "Demonstrate and verbalize techniques to reduce the risk of re-injury including: lifting, posture, body mechanics.    Time 6  Period Weeks   Status New   PT LONG TERM GOAL #2   Title "Pt will be independent with advanced HEP.    Time 6   Period Weeks   Status New   PT LONG TERM GOAL #3   Title "Pt will tolerate sitting for 1 hour without increased pain to ride in car without increased pain.   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  Pt has recently seen a pain management doctor and will lhave an injection for sacral/coccyx pain 10-12-14. Pt has been haveing low back pain for over a year  and demonstrates poor posture , decreased AROM in flex and ext  and pain when rising from chair.  Pt with pelvic level on R elelvated and tenderness over R Quadratus lumborum and mid line low back.  Pt  has decreased flexibility and  fair abdominal strength.  Pt would benefti from skilled PT for core conditioning and stengthenin with a basic back program as she is being managed by her physician.  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 without exacerbating pain and skilled PT could help her with correct her sacral sitting and flexed posture  for return to work without  injury   Pt will benefit from skilled therapeutic intervention in order to improve on the following deficits  Decreased activity tolerance;Decreased strength;Hypomobility;Decreased range of motion;Increased fascial restricitons;Impaired flexibility;Postural dysfunction;Improper body mechanics;Pain;Obesity;Increased muscle spasms   Rehab Potential Good   PT Frequency 2x / week   PT Duration 3 weeks   PT Treatment/Interventions ADLs/Self Care Home Management;Neuromuscular re-education;Ultrasound;Cryotherapy;Electrical Stimulation;Iontophoresis 22m/ml Dexamethasone;Moist Heat;Therapeutic exercise;Therapeutic activities;Functional mobility training;Patient/family education;Manual techniques;Taping;Dry needling   PT Next Visit Plan Pt needs education on abdominal setting and basic core and will progress.  Hamstring stetching/ review Quadratus lumborum   PT Home Exercise Plan QL stretch in Left sidelying and childs pose forward/right and left   Consulted and Agree with Plan of Care Patient         Problem List Patient Active Problem List   Diagnosis Date Noted  . Type 2 diabetes mellitus with hyperglycemia 12/20/2011  . Furuncle of pubic region 12/20/2011     By signing I understand that I am ordering/authorizing the use of Iontophoresis using 4 mg/mL of dexamethasone as a component of this plan of care.  LVoncille Lo PT 09/20/2014 5:24 PM Phone: [phone]: 3[facility] [address] [address]Phone: [phone]  Fax:  [phone]   PHYSICAL THERAPY DISCHARGE SUMMARY  Visits from Start of Care: 1  Current functional level related to goals / functional outcomes: Unknown   Remaining deficits:' Unknown   Education / Equipment: Initial HEP Plan: Patient agrees to discharge.  Patient  goals were not met. Patient is being discharged due to not returning since the last visit.  ?????       Pt had cancelled 2 appt and missed 2 appt and pt was contacted and said she was " going through  something right now" and could not attend therapy.  Voncille Lo, PT 11/15/2014 7:52 AM Phone: [phone] Fax: [phone]

## 2014-09-20 NOTE — Patient Instructions (Addendum)
Posture Tips DO: - stand tall and erect - keep chin tucked in - keep head and shoulders in alignment - check posture regularly in mirror or large window - pull head back against headrest in car seat;  Change your position often.  Sit with lumbar support. DON'T: - slouch or slump while watching TV or reading - sit, stand or lie in one position  for too long;  Sitting is especially hard on the spine so if you sit at a desk/use the computer, then stand up often!   Copyright  VHI. All rights reserved.  Posture - Standing   Good posture is important. Avoid slouching and forward head thrust. Maintain curve in low back and align ears over shoul- ders, hips over ankles.  Pull your belly button in toward your back bone. Stand with even weight in to toes and heels.  Stand with lifted chest like a golden thread to the sky with chin tucked.   Copyright  VHI. All rights reserved.  Posture - Sitting   Sit upright, head facing forward. Try using a roll to support lower back. Keep shoulders relaxed, and avoid rounded back. Keep hips level with knees. Avoid crossing legs for long periods. Sit on sit bone not your tail bone    Copyright  VHI. All rights reserved.   Given handout for Quadratus Lumborum Quadriped stretch handout forward and to each side 30 seconds each.  As done in clinic  Do 2-3 each.  2-3 x a day.  Also lie on left side and stretch Right Quadratus lumborum with pillow under left torso.  Watch a TV show while stretching.  As shown in clinic max stretch time truly 3 min  Voncille Lo, PT 09/20/2014 4:29 PM Phone: [phone] Fax: [phone]

## 2014-10-04 ENCOUNTER — Ambulatory Visit: Payer: Managed Care, Other (non HMO) | Admitting: Physical Therapy

## 2014-10-06 ENCOUNTER — Encounter: Payer: Managed Care, Other (non HMO) | Admitting: Physical Therapy

## 2014-10-10 ENCOUNTER — Ambulatory Visit: Payer: Managed Care, Other (non HMO) | Attending: Anesthesiology | Admitting: Physical Therapy

## 2014-10-13 ENCOUNTER — Ambulatory Visit: Payer: Managed Care, Other (non HMO) | Admitting: Physical Therapy

## 2014-10-13 ENCOUNTER — Telehealth: Payer: Self-pay | Admitting: Physical Therapy

## 2014-10-13 NOTE — Telephone Encounter (Signed)
Spoke with patient regarding two missed appointments this week. She has also canceled two previous appointments. She reports she is going through something right now and cannot attend physical therapy. She would like to come back at a later date. I instructed her to get a new referral if it was longer than 30 days before she could return.

## 2015-05-10 ENCOUNTER — Emergency Department (HOSPITAL_COMMUNITY)
Admission: EM | Admit: 2015-05-10 | Discharge: 2015-05-10 | Disposition: A | Discharge status: Home / Self Care | Payer: Managed Care, Other (non HMO) | Attending: Emergency Medicine | Admitting: Emergency Medicine

## 2015-05-10 DIAGNOSIS — L03317 Cellulitis of buttock: Secondary | ICD-10-CM | POA: Diagnosis not present

## 2015-05-10 DIAGNOSIS — Z794 Long term (current) use of insulin: Secondary | ICD-10-CM | POA: Diagnosis not present

## 2015-05-10 DIAGNOSIS — R739 Hyperglycemia, unspecified: Secondary | ICD-10-CM

## 2015-05-10 DIAGNOSIS — E1165 Type 2 diabetes mellitus with hyperglycemia: Secondary | ICD-10-CM | POA: Insufficient documentation

## 2015-05-10 DIAGNOSIS — Z3202 Encounter for pregnancy test, result negative: Secondary | ICD-10-CM | POA: Diagnosis not present

## 2015-05-10 DIAGNOSIS — F17200 Nicotine dependence, unspecified, uncomplicated: Secondary | ICD-10-CM | POA: Diagnosis not present

## 2015-05-10 DIAGNOSIS — J45909 Unspecified asthma, uncomplicated: Secondary | ICD-10-CM | POA: Insufficient documentation

## 2015-05-10 DIAGNOSIS — N898 Other specified noninflammatory disorders of vagina: Secondary | ICD-10-CM | POA: Diagnosis present

## 2015-05-10 DIAGNOSIS — I10 Essential (primary) hypertension: Secondary | ICD-10-CM | POA: Insufficient documentation

## 2015-05-10 LAB — BASIC METABOLIC PANEL
Anion gap: 9 (ref 5–15)
BUN: 8 mg/dL (ref 6–20)
CALCIUM: 8.9 mg/dL (ref 8.9–10.3)
CO2: 25 mmol/L (ref 22–32)
CREATININE: 0.6 mg/dL (ref 0.44–1.00)
Chloride: 100 mmol/L — ABNORMAL LOW (ref 101–111)
GFR calc non Af Amer: >60 mL/min (ref >60)
Glucose, Bld: 379 mg/dL — ABNORMAL HIGH (ref 65–99)
Potassium: 3.7 mmol/L (ref 3.5–5.1)
SODIUM: 134 mmol/L — AB (ref 135–145)

## 2015-05-10 LAB — CBC WITH DIFFERENTIAL/PLATELET
BASOS PCT: 0 %
Basophils Absolute: 0 10*3/uL (ref 0.0–0.1)
EOS ABS: 0.4 10*3/uL (ref 0.0–0.7)
EOS PCT: 3 %
HCT: 39.2 % (ref 36.0–46.0)
Hemoglobin: 12.7 g/dL (ref 12.0–15.0)
Lymphocytes Relative: 30 %
Lymphs Abs: 4.6 10*3/uL — ABNORMAL HIGH (ref 0.7–4.0)
MCH: 26.7 pg (ref 26.0–34.0)
MCHC: 32.4 g/dL (ref 30.0–36.0)
MCV: 82.4 fL (ref 78.0–100.0)
MONO ABS: 1.2 10*3/uL — AB (ref 0.1–1.0)
MONOS PCT: 8 %
NEUTROS PCT: 59 %
Neutro Abs: 9.4 10*3/uL — ABNORMAL HIGH (ref 1.7–7.7)
PLATELETS: 363 10*3/uL (ref 150–400)
RBC: 4.76 MIL/uL (ref 3.87–5.11)
RDW: 12.6 % (ref 11.5–15.5)
WBC: 15.7 10*3/uL — ABNORMAL HIGH (ref 4.0–10.5)

## 2015-05-10 LAB — CBG MONITORING, ED: GLUCOSE-CAPILLARY: 383 mg/dL — AB (ref 65–99)

## 2015-05-10 LAB — URINALYSIS, ROUTINE W REFLEX MICROSCOPIC
BILIRUBIN URINE: NEGATIVE
Glucose, UA: >1000 mg/dL — AB
HGB URINE DIPSTICK: NEGATIVE
KETONES UR: NEGATIVE mg/dL
Leukocytes, UA: NEGATIVE
NITRITE: NEGATIVE
Protein, ur: NEGATIVE mg/dL
Specific Gravity, Urine: 1.046 — ABNORMAL HIGH (ref 1.005–1.030)
pH: 6 (ref 5.0–8.0)

## 2015-05-10 LAB — URINE MICROSCOPIC-ADD ON
BACTERIA UA: NONE SEEN
RBC / HPF: NONE SEEN RBC/hpf (ref 0–5)

## 2015-05-10 LAB — POC URINE PREG, ED: PREG TEST UR: NEGATIVE

## 2015-05-10 MED ORDER — SULFAMETHOXAZOLE-TRIMETHOPRIM 800-160 MG PO TABS: 1.0000 | ORAL_TABLET | Freq: Two times a day (BID) | ORAL | Status: AC

## 2015-05-10 MED ORDER — FLUCONAZOLE 150 MG PO TABS: ORAL_TABLET | ORAL | Status: AC

## 2015-05-10 MED ORDER — FENTANYL CITRATE (PF) 100 MCG/2ML IJ SOLN
50.0000 ug | Freq: Once | INTRAMUSCULAR | Status: AC
  Administered 2015-05-10: 50 ug via INTRAMUSCULAR
  Filled 2015-05-10: qty 2

## 2015-05-10 MED ORDER — CEPHALEXIN 500 MG PO CAPS: 500.0000 mg | ORAL_CAPSULE | Freq: Four times a day (QID) | ORAL | Status: AC

## 2015-05-10 MED ORDER — SODIUM CHLORIDE 0.9 % IV BOLUS (SEPSIS)
1000.0000 mL | Freq: Once | INTRAVENOUS | Status: AC
  Administered 2015-05-10: 1000 mL via INTRAVENOUS

## 2015-05-10 NOTE — ED Provider Notes (Signed)
CSN: [id number]     Arrival date & time 05/10/15  1456 History   First MD Initiated Contact with Patient 05/10/15 1641     Chief Complaint  Patient presents with  . Abscess  . Hyperglycemia  . Vaginal Discharge     (Consider location/radiation/quality/duration/timing/severity/associated sxs/prior Treatment) HPI Catherine Cooley is a 35 y.o. female with PMH significant for DMT2, asthma, HTN, and migraine who presents with multiple complaints.  She complains of gradual onset, constant, worsening painful red area in her right perianal region x 5 days.  Tried warm compresses without relief.  Has been taking Percocet for pain.  No aggravating/relieving factors.  She is also complaining of vaginal itching and white vaginal discharge.  She reports hx of yeast infections, and this feels similar.  She is also complaining of hyperglycemia and fatigue.  She reports her blood glucose normally ranges from 170-200.  Today it was >300.  She takes Lantus 30 U QHS.  Denies missed doses.  Denies HA, cough, SOB, abdominal pain, N/V/D, or urinary symptoms.   Past Medical History  Diagnosis Date  . Diabetes mellitus   . Asthma   . Hypertension   . Migraine    Past Surgical History  Procedure Laterality Date  . No past surgeries     Family History  Problem Relation Age of Onset  . Hypertension Mother   . Diabetes Father    Social History  Substance Use Topics  . Smoking status: Current Every Day Smoker -- 0.50 packs/day  . Smokeless tobacco: Not on file  . Alcohol Use: Yes   OB History    Gravida Para Term Preterm AB TAB SAB Ectopic Multiple Living   0              Review of Systems All other systems negative unless otherwise stated in HPI    Allergies  Vicodin  Home Medications   Prior to Admission medications   Medication Sig Start Date End Date Taking? Authorizing Provider  Insulin Glargine (LANTUS) 100 UNIT/ML Solostar Pen Inject 30 Units into the skin every evening.   Yes  Historical Provider, MD  oxyCODONE (ROXICODONE) 15 MG immediate release tablet Take 15 mg by mouth every 4 (four) hours as needed for pain.   Yes Historical Provider, MD  cephALEXin (KEFLEX) 500 MG capsule Take 1 capsule (500 mg total) by mouth 4 (four) times daily. 05/10/15   Gloriann Loan, PA-C  fluconazole (DIFLUCAN) 150 MG tablet Take 1 tablet after you finish your course of antibiotics. 05/10/15   Gloriann Loan, PA-C  naproxen (NAPROSYN) 500 MG tablet Take 1 tablet (500 mg total) by mouth 2 (two) times daily as needed for mild pain, moderate pain or headache (TAKE WITH MEALS.). Patient not taking: Reported on 09/20/2014 06/07/14   Mercedes Camprubi-Soms, PA-C  naproxen (NAPROSYN) 500 MG tablet Take 1 tablet (500 mg total) by mouth 2 (two) times daily. Patient not taking: Reported on 09/20/2014 08/29/14   Fredia Sorrow, MD  oxyCODONE-acetaminophen (PERCOCET) 5-325 MG per tablet Take 1 tablet by mouth every 6 (six) hours as needed for moderate pain. Patient not taking: Reported on 09/20/2014 01/30/13   Evelina Bucy, MD  oxyCODONE-acetaminophen (PERCOCET) 5-325 MG per tablet Take 1 tablet by mouth every 6 (six) hours as needed for severe pain. Patient not taking: Reported on 09/20/2014 06/07/14   Mercedes Camprubi-Soms, PA-C  pantoprazole (PROTONIX) 20 MG tablet Take 1 tablet (20 mg total) by mouth daily. Patient not taking: Reported on 04/23/2014 01/30/13  Evelina Bucy, MD  sulfamethoxazole-trimethoprim (BACTRIM DS,SEPTRA DS) 800-160 MG tablet Take 1 tablet by mouth 2 (two) times daily. 05/10/15 05/17/15  Judeth Gilles, PA-C   BP 124/79 mmHg  Pulse 75  Temp(Src) 97.9 F (36.6 C) (Oral)  Resp 18  SpO2 98%  LMP 04/10/2015 Physical Exam  Constitutional: She is oriented to person, place, and time. She appears well-developed and well-nourished.  Non-toxic appearance. She does not have a sickly appearance. She does not appear ill.  HENT:  Head: Normocephalic and atraumatic.  Mouth/Throat: Oropharynx is clear and  moist.  Eyes: Conjunctivae are normal. Pupils are equal, round, and reactive to light.  Neck: Normal range of motion. Neck supple.  Cardiovascular: Normal rate, regular rhythm and normal heart sounds.   No murmur heard. Pulmonary/Chest: Effort normal and breath sounds normal. No accessory muscle usage or stridor. No respiratory distress. She has no wheezes. She has no rhonchi. She has no rales.  Abdominal: Soft. Bowel sounds are normal. She exhibits no distension. There is no tenderness. There is no rebound and no guarding.  Genitourinary: Vagina normal.    There is no rash or tenderness on the right labia. There is no rash or tenderness on the left labia. No erythema, tenderness or bleeding in the vagina. No foreign body around the vagina. No vaginal discharge found.  Chaperone present.   Unable to perform internal pelvic exam due to patient non-compliance.  Patient tearful, crying at times, and continuously clenching.  Musculoskeletal: Normal range of motion.  Lymphadenopathy:    She has no cervical adenopathy.  Neurological: She is alert and oriented to person, place, and time.  Speech clear without dysarthria.  Skin: Skin is warm and dry.  Psychiatric: She has a normal mood and affect. Her behavior is normal.    ED Course  Procedures (including critical care time) Labs Review Labs Reviewed  CBC WITH DIFFERENTIAL/PLATELET - Abnormal; Notable for the following:    WBC 15.7 (*)    Neutro Abs 9.4 (*)    Lymphs Abs 4.6 (*)    Monocytes Absolute 1.2 (*)    All other components within normal limits  BASIC METABOLIC PANEL - Abnormal; Notable for the following:    Sodium 134 (*)    Chloride 100 (*)    Glucose, Bld 379 (*)    All other components within normal limits  URINALYSIS, ROUTINE W REFLEX MICROSCOPIC (NOT AT [facility]) - Abnormal; Notable for the following:    Specific Gravity, Urine 1.046 (*)    Glucose, UA >1000 (*)    All other components within normal limits  URINE  MICROSCOPIC-ADD ON - Abnormal; Notable for the following:    Squamous Epithelial / LPF 0-5 (*)    All other components within normal limits  CBG MONITORING, ED - Abnormal; Notable for the following:    Glucose-Capillary 383 (*)    All other components within normal limits  POC URINE PREG, ED    Imaging Review No results found. I have personally reviewed and evaluated these images and lab results as part of my medical decision-making.   EKG Interpretation None      MDM   Final diagnoses:  Cellulitis of buttock  Hyperglycemia  Vaginal discharge   Patient presents with findings consistent with cellulitis.  No fluctuance.  I&D not indicated at this time.  On exam, patient appears well, non-toxic or septic.  Heart RRR, lungs CTAB, abdomen soft and benign.  I attempted pelvic exam multiple times, and was unable to proceed  to internal evaluation due to patient non-compliance, patient continuously clenching and would not relax. Therefore, I was unable to test for GC/chlamydia.  Patient states she is a virgin, and has never had a pelvic exam or pap smear.  Area of warmth, erythema, and induration to right perineum.  No fluctuance.  Again, no indication for I&D at this time.  Findings consistent with cellulitis.  CBG 383.  Will check CBC, BMP, and UA to evaluate for metabolic acidosis.  Labs without acute abnormalities, no evidence of metabolic acidosis.  Elevated glucose 379.  UA without signs of infection.  Patient received 1L NS and Fentanyl for pain.  Patient takes Oxycodone for pain at home.  Offered ibuprofen, patient declined.  Discharged home with bactrim and keflex.  Patient instructed to take Fluconazole after she finishes her course of antibiotics.  Given follow up with [facility] for preventative women's health screenings.  Discussed importance of PCP follow up to establish tighter glucose management.  Discussed return precautions.  Patient agrees and acknowledges the above plan for  discharge.  Case has been discussed with Dr. Gilford Raid who agrees with the above plan for discharge.        Gloriann Loan, PA-C 05/10/15 2003

## 2015-05-10 NOTE — Discharge Instructions (Signed)
Your labs today do not show any acute abnormalities.  Please take Bactrim twice daily and Keflex 4 times daily for the net 7 days.  Continue taking Oxycodone for pain.  Take Fluconazole, for yeast infection, AFTER you finish taking all of your antibiotics, so on day 7.  Follow up with your primary care doctor for tighter management of your blood sugar.  Follow up with [facility] to schedule a gynecology appointment.    Cellulitis Cellulitis is an infection of the skin and the tissue beneath it. The infected area is usually red and tender. Cellulitis occurs most often in the arms and lower legs.  CAUSES  Cellulitis is caused by bacteria that enter the skin through cracks or cuts in the skin. The most common types of bacteria that cause cellulitis are staphylococci and streptococci. SIGNS AND SYMPTOMS   Redness and warmth.  Swelling.  Tenderness or pain.  Fever. DIAGNOSIS  Your health care provider can usually determine what is wrong based on a physical exam. Blood tests may also be done. TREATMENT  Treatment usually involves taking an antibiotic medicine. HOME CARE INSTRUCTIONS   Take your antibiotic medicine as directed by your health care provider. Finish the antibiotic even if you start to feel better.  Keep the infected arm or leg elevated to reduce swelling.  Apply a warm cloth to the affected area up to 4 times per day to relieve pain.  Take medicines only as directed by your health care provider.  Keep all follow-up visits as directed by your health care provider. SEEK MEDICAL CARE IF:   You notice red streaks coming from the infected area.  Your red area gets larger or turns dark in color.  Your bone or joint underneath the infected area becomes painful after the skin has healed.  Your infection returns in the same area or another area.  You notice a swollen bump in the infected area.  You develop new symptoms.  You have a fever. SEEK IMMEDIATE MEDICAL  CARE IF:   You feel very sleepy.  You develop vomiting or diarrhea.  You have a general ill feeling (malaise) with muscle aches and pains.   This information is not intended to replace advice given to you by your health care provider. Make sure you discuss any questions you have with your health care provider.   Document Released: 10/03/2004 Document Revised: 09/14/2014 Document Reviewed: 03/11/2011 Elsevier Interactive Patient Education [employer].

## 2015-05-10 NOTE — ED Notes (Signed)
1700 PA at bedside.

## 2015-05-10 NOTE — ED Notes (Signed)
To bathroom for specimen

## 2015-05-10 NOTE — ED Notes (Signed)
Pt complains of of abscess to right buttocks. Pt also complains of hyperglycemia and fatigue, pt has hx of type 2 DM. Pt states she also has vaginal discharge and vaginal irritation, denies odor. Pt states she has hx of yeast infections.

## 2015-08-17 ENCOUNTER — Encounter (HOSPITAL_COMMUNITY): Payer: Self-pay | Admitting: Emergency Medicine

## 2015-08-17 ENCOUNTER — Emergency Department (HOSPITAL_COMMUNITY)
Admission: EM | Admit: 2015-08-17 | Discharge: 2015-08-17 | Disposition: A | Discharge status: Home / Self Care | Payer: Managed Care, Other (non HMO) | Attending: Emergency Medicine | Admitting: Emergency Medicine

## 2015-08-17 DIAGNOSIS — J45909 Unspecified asthma, uncomplicated: Secondary | ICD-10-CM | POA: Diagnosis not present

## 2015-08-17 DIAGNOSIS — I10 Essential (primary) hypertension: Secondary | ICD-10-CM | POA: Insufficient documentation

## 2015-08-17 DIAGNOSIS — Z794 Long term (current) use of insulin: Secondary | ICD-10-CM | POA: Insufficient documentation

## 2015-08-17 DIAGNOSIS — Z791 Long term (current) use of non-steroidal anti-inflammatories (NSAID): Secondary | ICD-10-CM | POA: Diagnosis not present

## 2015-08-17 DIAGNOSIS — Z79899 Other long term (current) drug therapy: Secondary | ICD-10-CM | POA: Diagnosis not present

## 2015-08-17 DIAGNOSIS — M5441 Lumbago with sciatica, right side: Secondary | ICD-10-CM

## 2015-08-17 DIAGNOSIS — E119 Type 2 diabetes mellitus without complications: Secondary | ICD-10-CM | POA: Insufficient documentation

## 2015-08-17 DIAGNOSIS — M25551 Pain in right hip: Secondary | ICD-10-CM | POA: Diagnosis present

## 2015-08-17 DIAGNOSIS — F17200 Nicotine dependence, unspecified, uncomplicated: Secondary | ICD-10-CM | POA: Diagnosis not present

## 2015-08-17 NOTE — ED Triage Notes (Signed)
Patient presents for right lower back pain x2 days, right hip pain x2 months and a "knot" to right groin x2 months. Reports falling yesterday, no LOC, no new injury, no obvious deformity, denies urinary symptoms. Reports history of chronic back pain, but states this pain is different.

## 2015-08-17 NOTE — ED Notes (Signed)
Patient c/o chronic low back pain and right hip/groin pain which was so severe yesterday she fell.  Patient denies new or worsening pain, but states the pain in her low back radiates into her right leg and groin so severely that she sometimes cannot bear weight on the RLE.  Patient is ambulatory, but not without pain.  On exam, patient has increased pain in right lumbar with leg raise in a recumbent position.  No pain with elevation of LLE.  +2 bilateral LE pulses and full ROM in both LE.  Patient has some pain with palpation of right groin and hip. LAD noted to right inguinal lymph nodes, which patient states is new within the last several weeks.  She denies fever, N/V and unexplained weight loss.

## 2015-08-17 NOTE — ED Notes (Signed)
ED attending went to see patient and discovered she had left.  Patient had not informed staff she was leaving, but gown was laying on bed.

## 2015-08-17 NOTE — ED Provider Notes (Signed)
[facility] DEPT Provider Note   CSN: [id number] Arrival date & time: 08/17/15  1038  First Provider Contact:  None       History   Chief Complaint Chief Complaint  Patient presents with  . Fall  . Hip Pain  . Leg Pain    HPI Catherine Cooley is a 35 y.o. female.  HPI   35 year old female presents today with chronic back and leg pain. Patient reports long-standing history of lower midline back pain and right posterior hip pain. She has been seen numerous times by primary care, orthopedics with CT scans, MRI, and subsequent referral to pain management. Pt notes most recent MRI was in January of this year showing arthritis. Patient notes that the symptoms are sharp in nature, made worse with back extension or deep flexion. She notes the sharp pain radiates into her right leg, this is been more pronounced over the last 2 days. Patient notes that yesterday she was lifting heavy boxes from the floor when the severe pain came on and she fell to the ground;  She denies any injuries from the fall. At the present moment patient denies any lower extremity pain, strength or sensory deficits. Patient does note that intermittently she gets tingling in her toes when her blood sugar gets high, none presently. She denies any abdominal pain, fever, chills, IV drug use, bowel or bladder dysfunction, or any other red flags. Patient is seen by her primary care for pain management as she was unable to 4 visits at the pain management clinic. Patient notes that she takes oxycodone as needed for the pain. Patient denies any difficulty with ambulation, and continues to work.    Past Medical History:  Diagnosis Date  . Asthma   . Diabetes mellitus   . Hypertension   . Migraine     Patient Active Problem List   Diagnosis Date Noted  . Type 2 diabetes mellitus with hyperglycemia ([place]) 12/20/2011  . Furuncle of pubic region 12/20/2011    Past Surgical History:  Procedure Laterality Date  . NO PAST  SURGERIES      OB History    Gravida Para Term Preterm AB Living   0             SAB TAB Ectopic Multiple Live Births                   Home Medications    Prior to Admission medications   Medication Sig Start Date End Date Taking? Authorizing Provider  ibuprofen (ADVIL,MOTRIN) 800 MG tablet Take 800 mg by mouth daily.   Yes Historical Provider, MD  insulin aspart (NOVOLOG) 100 UNIT/ML injection Inject 10 Units into the skin 2 (two) times daily.   Yes Historical Provider, MD  Insulin Glargine (LANTUS) 100 UNIT/ML Solostar Pen Inject 30 Units into the skin at bedtime.    Yes Historical Provider, MD  oxyCODONE (ROXICODONE) 15 MG immediate release tablet Take 15 mg by mouth every 4 (four) hours as needed for pain.   Yes Historical Provider, MD  cephALEXin (KEFLEX) 500 MG capsule Take 1 capsule (500 mg total) by mouth 4 (four) times daily. Patient not taking: Reported on 08/17/2015 05/10/15   Gloriann Loan, PA-C  fluconazole (DIFLUCAN) 150 MG tablet Take 1 tablet after you finish your course of antibiotics. Patient not taking: Reported on 08/17/2015 05/10/15   Gloriann Loan, PA-C  pantoprazole (PROTONIX) 20 MG tablet Take 1 tablet (20 mg total) by mouth daily. Patient not taking: Reported on  04/23/2014 01/30/13   Evelina Bucy, MD    Family History Family History  Problem Relation Age of Onset  . Hypertension Mother   . Diabetes Father     Social History Social History  Substance Use Topics  . Smoking status: Current Every Day Smoker    Packs/day: 0.50  . Smokeless tobacco: Never Used  . Alcohol use Yes     Allergies   Vicodin [hydrocodone-acetaminophen]   Review of Systems Review of Systems  All other systems reviewed and are negative.   Physical Exam Updated Vital Signs BP 106/69 (BP Location: Left Arm)   Pulse 71   Temp 98 F (36.7 C) (Oral)   Resp 18   LMP 08/15/2015 (Approximate)   SpO2 100%   Physical Exam  Constitutional: She is oriented to person, place, and  time. She appears well-developed and well-nourished. No distress.  HENT:  Head: Normocephalic.  Neck: Normal range of motion. Neck supple.  Pulmonary/Chest: Effort normal.  Musculoskeletal: Normal range of motion. She exhibits tenderness. She exhibits no edema.  No C, T, or L spine tenderness to palpation. No obvious signs of trauma, deformity, infection, step-offs. Lung expansion normal. No scoliosis or kyphosis. Bilateral lower extremity strength 5 out of 5, sensation grossly intact, patellar reflexes 2+, pedal pulses 2+, Refill less than 3 seconds.  TTP of right lateral lower lumbar soft tissue and posterior hip  Straight leg negative Ambulates without difficulty   Neurological: She is alert and oriented to person, place, and time.  Skin: Skin is warm and dry. She is not diaphoretic.  Psychiatric: She has a normal mood and affect. Her behavior is normal. Judgment and thought content normal.  Nursing note and vitals reviewed.    ED Treatments / Results  Labs (all labs ordered are listed, but only abnormal results are displayed) Labs Reviewed - No data to display  EKG  EKG Interpretation None       Radiology No results found.  Procedures Procedures (including critical care time)  Medications Ordered in ED Medications - No data to display   Initial Impression / Assessment and Plan / ED Course  I have reviewed the triage vital signs and the nursing notes.  Pertinent labs & imaging results that were available during my care of the patient were reviewed by me and considered in my medical decision making (see chart for details).  Clinical Course    Final Clinical Impressions(s) / ED Diagnoses   Final diagnoses:  Right-sided low back pain with right-sided sciatica   Labs:   Imaging:  Consults:  Therapeutics:  Discharge Meds:   Assessment/Plan:  35 year old female presents today with complaints of back pain. Patient has a chronic history of the same, with  what appears to be episodic radiation down into her right leg. Patient has no neurological deficits, ambulatory without difficulty, she has no right flags for back pain. Patient has home medications, with numerous imaging studies most recently in January of this year. I don't not see a need for further imaging at this time, patient will be referred to neurosurgery for reevaluation and further management. She is given strict return precautions, she verbalized understanding and agreement to today's plan had no further questions or concerns at the time discharge     New Prescriptions Discharge Medication List as of 08/17/2015  2:58 PM       Okey Regal, PA-C 08/18/15 [place], PA-C 08/19/15 [id number]    Fatima Blank, MD 08/19/15 1208

## 2016-06-05 ENCOUNTER — Emergency Department (HOSPITAL_COMMUNITY): Payer: No Typology Code available for payment source

## 2016-06-05 ENCOUNTER — Encounter (HOSPITAL_COMMUNITY): Payer: Self-pay | Admitting: Emergency Medicine

## 2016-06-05 ENCOUNTER — Emergency Department (HOSPITAL_COMMUNITY)
Admission: EM | Admit: 2016-06-05 | Discharge: 2016-06-05 | Disposition: A | Discharge status: Home / Self Care | Payer: No Typology Code available for payment source | Attending: Emergency Medicine | Admitting: Emergency Medicine

## 2016-06-05 DIAGNOSIS — I10 Essential (primary) hypertension: Secondary | ICD-10-CM | POA: Insufficient documentation

## 2016-06-05 DIAGNOSIS — Y998 Other external cause status: Secondary | ICD-10-CM | POA: Insufficient documentation

## 2016-06-05 DIAGNOSIS — J45909 Unspecified asthma, uncomplicated: Secondary | ICD-10-CM | POA: Diagnosis not present

## 2016-06-05 DIAGNOSIS — S199XXA Unspecified injury of neck, initial encounter: Secondary | ICD-10-CM | POA: Insufficient documentation

## 2016-06-05 DIAGNOSIS — Y9289 Other specified places as the place of occurrence of the external cause: Secondary | ICD-10-CM | POA: Insufficient documentation

## 2016-06-05 DIAGNOSIS — Z720 Tobacco use: Secondary | ICD-10-CM | POA: Insufficient documentation

## 2016-06-05 DIAGNOSIS — M542 Cervicalgia: Secondary | ICD-10-CM

## 2016-06-05 DIAGNOSIS — R51 Headache: Secondary | ICD-10-CM | POA: Diagnosis not present

## 2016-06-05 DIAGNOSIS — Y9389 Activity, other specified: Secondary | ICD-10-CM | POA: Diagnosis not present

## 2016-06-05 DIAGNOSIS — Z794 Long term (current) use of insulin: Secondary | ICD-10-CM | POA: Insufficient documentation

## 2016-06-05 DIAGNOSIS — E119 Type 2 diabetes mellitus without complications: Secondary | ICD-10-CM | POA: Insufficient documentation

## 2016-06-05 DIAGNOSIS — R079 Chest pain, unspecified: Secondary | ICD-10-CM | POA: Diagnosis not present

## 2016-06-05 DIAGNOSIS — M7918 Myalgia, other site: Secondary | ICD-10-CM

## 2016-06-05 LAB — PROTIME-INR
INR: 0.98
Prothrombin Time: 13 seconds (ref 11.4–15.2)

## 2016-06-05 LAB — I-STAT CHEM 8, ED
BUN: 7 mg/dL (ref 6–20)
Calcium, Ion: 1.1 mmol/L — ABNORMAL LOW (ref 1.15–1.40)
Chloride: 100 mmol/L — ABNORMAL LOW (ref 101–111)
Creatinine, Ser: 0.5 mg/dL (ref 0.44–1.00)
Glucose, Bld: 294 mg/dL — ABNORMAL HIGH (ref 65–99)
HEMATOCRIT: 42 % (ref 36.0–46.0)
Hemoglobin: 14.3 g/dL (ref 12.0–15.0)
POTASSIUM: 4.1 mmol/L (ref 3.5–5.1)
SODIUM: 136 mmol/L (ref 135–145)
TCO2: 25 mmol/L (ref 0–100)

## 2016-06-05 LAB — CBC
HEMATOCRIT: 39.6 % (ref 36.0–46.0)
Hemoglobin: 12.9 g/dL (ref 12.0–15.0)
MCH: 27.2 pg (ref 26.0–34.0)
MCHC: 32.6 g/dL (ref 30.0–36.0)
MCV: 83.4 fL (ref 78.0–100.0)
Platelets: 378 10*3/uL (ref 150–400)
RBC: 4.75 MIL/uL (ref 3.87–5.11)
RDW: 13.6 % (ref 11.5–15.5)
WBC: 16.7 10*3/uL — ABNORMAL HIGH (ref 4.0–10.5)

## 2016-06-05 LAB — COMPREHENSIVE METABOLIC PANEL
ALBUMIN: 3.5 g/dL (ref 3.5–5.0)
ALT: 16 U/L (ref 14–54)
AST: 28 U/L (ref 15–41)
Alkaline Phosphatase: 68 U/L (ref 38–126)
Anion gap: 10 (ref 5–15)
BUN: 6 mg/dL (ref 6–20)
CHLORIDE: 102 mmol/L (ref 101–111)
CO2: 23 mmol/L (ref 22–32)
CREATININE: 0.64 mg/dL (ref 0.44–1.00)
Calcium: 9 mg/dL (ref 8.9–10.3)
GFR calc Af Amer: >60 mL/min (ref >60)
GFR calc non Af Amer: >60 mL/min (ref >60)
Glucose, Bld: 292 mg/dL — ABNORMAL HIGH (ref 65–99)
POTASSIUM: 4 mmol/L (ref 3.5–5.1)
SODIUM: 135 mmol/L (ref 135–145)
Total Bilirubin: 0.3 mg/dL (ref 0.3–1.2)
Total Protein: 6.5 g/dL (ref 6.5–8.1)

## 2016-06-05 LAB — I-STAT BETA HCG BLOOD, ED (MC, WL, AP ONLY): I-stat hCG, quantitative: <5 m[IU]/mL (ref <5)

## 2016-06-05 LAB — I-STAT CG4 LACTIC ACID, ED: Lactic Acid, Venous: 2.2 mmol/L (ref 0.5–1.9)

## 2016-06-05 MED ORDER — OXYCODONE HCL 5 MG PO TABS
5.0000 mg | ORAL_TABLET | Freq: Once | ORAL | Status: AC
  Administered 2016-06-05: 5 mg via ORAL
  Filled 2016-06-05: qty 1

## 2016-06-05 MED ORDER — SODIUM CHLORIDE 0.9 % IV BOLUS (SEPSIS)
1000.0000 mL | Freq: Once | INTRAVENOUS | Status: AC
  Administered 2016-06-05: 1000 mL via INTRAVENOUS

## 2016-06-05 MED ORDER — IBUPROFEN 400 MG PO TABS
600.0000 mg | ORAL_TABLET | Freq: Once | ORAL | Status: AC
  Administered 2016-06-05: 600 mg via ORAL
  Filled 2016-06-05: qty 1

## 2016-06-05 MED ORDER — OXYCODONE HCL 5 MG PO CAPS: 5.0000 mg | ORAL_CAPSULE | ORAL | 0 refills | Status: AC | PRN

## 2016-06-05 MED ORDER — HYDROMORPHONE HCL 1 MG/ML IJ SOLN
1.0000 mg | Freq: Once | INTRAMUSCULAR | Status: AC
  Administered 2016-06-05: 1 mg via INTRAVENOUS

## 2016-06-05 MED ORDER — FENTANYL CITRATE (PF) 100 MCG/2ML IJ SOLN
50.0000 ug | Freq: Once | INTRAMUSCULAR | Status: AC
  Administered 2016-06-05: 50 ug via INTRAVENOUS

## 2016-06-05 MED ORDER — HYDROMORPHONE HCL 1 MG/ML IJ SOLN
INTRAMUSCULAR | Status: AC
  Filled 2016-06-05: qty 1

## 2016-06-05 MED ORDER — FENTANYL CITRATE (PF) 100 MCG/2ML IJ SOLN
INTRAMUSCULAR | Status: DC
Start: 2016-06-05 — End: 2016-06-06
  Filled 2016-06-05: qty 2

## 2016-06-05 NOTE — ED Notes (Signed)
Pt and family placed EMS ccollar back on despite staff instructions.  Patient left at this time with all belongings.
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
## 2016-06-05 NOTE — ED Notes (Signed)
MD Jeneen Rinks and MD Woodrum at bedside, to order pain meds.

## 2016-06-05 NOTE — ED Notes (Signed)
Pt wanted stronger RX than ibuprofen for pain management. Notified Dr. Jeneen Rinks, who will come speak with patient. Wants something to keep knee straight and neck straight. Informed patient that this will decrease her mobility in a negative manner, she needs to move her joints to keep them limber. Applied knee sleeve for comfort.

## 2016-06-05 NOTE — Discharge Instructions (Signed)
call or return to have any questions, new symptoms, change in symptoms, or symptoms that you do not understand. You can take 650 mg Tylenol as well as 600 mg ibuprofen every 6-8 hours for pain for the next 2-3 days.

## 2016-06-05 NOTE — ED Notes (Signed)
Pt up to bathroom independently without assistance.

## 2016-06-05 NOTE — ED Notes (Signed)
Lactic acid result given to Dr. James 
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
## 2016-06-07 ENCOUNTER — Encounter (HOSPITAL_COMMUNITY): Payer: Self-pay | Admitting: Emergency Medicine

## 2016-06-07 ENCOUNTER — Emergency Department (HOSPITAL_COMMUNITY)
Admission: EM | Admit: 2016-06-07 | Discharge: 2016-06-07 | Disposition: A | Discharge status: Home / Self Care | Payer: No Typology Code available for payment source | Attending: Emergency Medicine | Admitting: Emergency Medicine

## 2016-06-07 DIAGNOSIS — M542 Cervicalgia: Secondary | ICD-10-CM | POA: Insufficient documentation

## 2016-06-07 DIAGNOSIS — Y929 Unspecified place or not applicable: Secondary | ICD-10-CM | POA: Diagnosis not present

## 2016-06-07 DIAGNOSIS — E119 Type 2 diabetes mellitus without complications: Secondary | ICD-10-CM | POA: Diagnosis not present

## 2016-06-07 DIAGNOSIS — M25562 Pain in left knee: Secondary | ICD-10-CM

## 2016-06-07 DIAGNOSIS — F17200 Nicotine dependence, unspecified, uncomplicated: Secondary | ICD-10-CM | POA: Diagnosis not present

## 2016-06-07 DIAGNOSIS — I10 Essential (primary) hypertension: Secondary | ICD-10-CM | POA: Diagnosis not present

## 2016-06-07 DIAGNOSIS — Y93I9 Activity, other involving external motion: Secondary | ICD-10-CM | POA: Diagnosis not present

## 2016-06-07 DIAGNOSIS — M546 Pain in thoracic spine: Secondary | ICD-10-CM

## 2016-06-07 DIAGNOSIS — M545 Low back pain, unspecified: Secondary | ICD-10-CM

## 2016-06-07 DIAGNOSIS — Z794 Long term (current) use of insulin: Secondary | ICD-10-CM | POA: Diagnosis not present

## 2016-06-07 DIAGNOSIS — J45909 Unspecified asthma, uncomplicated: Secondary | ICD-10-CM | POA: Diagnosis not present

## 2016-06-07 DIAGNOSIS — Y999 Unspecified external cause status: Secondary | ICD-10-CM | POA: Diagnosis not present

## 2016-06-07 MED ORDER — KETOROLAC TROMETHAMINE 60 MG/2ML IM SOLN
30.0000 mg | Freq: Once | INTRAMUSCULAR | Status: AC
  Administered 2016-06-07: 30 mg via INTRAMUSCULAR
  Filled 2016-06-07: qty 2

## 2016-06-07 NOTE — Discharge Instructions (Signed)
Take ibuprofen as directed for inflammation and pain with oxycodone for breakthrough pain and flexeril for muscle relaxation but do not drive or operate machinery with hydrocodone or valium use. Ice to areas of soreness for the next few days and then may move to heat. Expect to be sore for the next few day and follow up with primary care physician for recheck of ongoing symptoms but return to ER for emergent changing or worsening of symptoms.
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
